# Patient Record
Sex: Male | Born: 1975 | Race: White | Hispanic: No | Smoking: Current every day smoker
Health system: Southern US, Community
[De-identification: ages and names within clinical notes are randomized; demographics above are authoritative.]

## PROBLEM LIST (undated history)

## (undated) DIAGNOSIS — B1920 Unspecified viral hepatitis C without hepatic coma: Secondary | ICD-10-CM

## (undated) DIAGNOSIS — I10 Essential (primary) hypertension: Secondary | ICD-10-CM

## (undated) DIAGNOSIS — F1910 Other psychoactive substance abuse, uncomplicated: Secondary | ICD-10-CM

## (undated) DIAGNOSIS — K922 Gastrointestinal hemorrhage, unspecified: Secondary | ICD-10-CM

## (undated) DIAGNOSIS — M1990 Unspecified osteoarthritis, unspecified site: Secondary | ICD-10-CM

## (undated) DIAGNOSIS — K219 Gastro-esophageal reflux disease without esophagitis: Secondary | ICD-10-CM

## (undated) DIAGNOSIS — D649 Anemia, unspecified: Secondary | ICD-10-CM

## (undated) DIAGNOSIS — M5430 Sciatica, unspecified side: Secondary | ICD-10-CM

## (undated) DIAGNOSIS — M549 Dorsalgia, unspecified: Secondary | ICD-10-CM

## (undated) DIAGNOSIS — G8929 Other chronic pain: Secondary | ICD-10-CM

## (undated) HISTORY — PX: SHOULDER ARTHROSCOPY: SHX128

## (undated) HISTORY — PX: NECK SURGERY: SHX720

## (undated) HISTORY — PX: KNEE SURGERY: SHX244

## (undated) HISTORY — PX: BACK SURGERY: SHX140

## (undated) HISTORY — PX: ANTERIOR CERVICAL DECOMP/DISCECTOMY FUSION: SHX1161

---

## 2001-06-01 ENCOUNTER — Emergency Department (HOSPITAL_COMMUNITY): Admission: EM | Admit: 2001-06-01 | Discharge: 2001-06-01 | Payer: Self-pay | Admitting: Emergency Medicine

## 2003-11-17 ENCOUNTER — Ambulatory Visit (HOSPITAL_COMMUNITY): Admission: RE | Admit: 2003-11-17 | Discharge: 2003-11-17 | Payer: Self-pay | Admitting: Family Medicine

## 2004-06-04 ENCOUNTER — Ambulatory Visit (HOSPITAL_COMMUNITY): Admission: RE | Admit: 2004-06-04 | Discharge: 2004-06-04 | Payer: Self-pay | Admitting: Family Medicine

## 2004-08-28 ENCOUNTER — Encounter: Admission: RE | Admit: 2004-08-28 | Discharge: 2004-08-28 | Payer: Self-pay | Admitting: Neurosurgery

## 2004-09-12 ENCOUNTER — Encounter: Admission: RE | Admit: 2004-09-12 | Discharge: 2004-09-12 | Payer: Self-pay | Admitting: Neurosurgery

## 2005-02-06 ENCOUNTER — Ambulatory Visit (HOSPITAL_COMMUNITY): Admission: RE | Admit: 2005-02-06 | Discharge: 2005-02-07 | Payer: Self-pay | Admitting: Neurosurgery

## 2005-04-02 ENCOUNTER — Ambulatory Visit (HOSPITAL_COMMUNITY): Admission: RE | Admit: 2005-04-02 | Discharge: 2005-04-02 | Payer: Self-pay | Admitting: Neurosurgery

## 2005-08-17 ENCOUNTER — Emergency Department (HOSPITAL_COMMUNITY): Admission: EM | Admit: 2005-08-17 | Discharge: 2005-08-17 | Payer: Self-pay | Admitting: Emergency Medicine

## 2010-01-05 ENCOUNTER — Emergency Department (HOSPITAL_COMMUNITY): Admission: EM | Admit: 2010-01-05 | Discharge: 2010-01-05 | Payer: Self-pay | Admitting: Emergency Medicine

## 2010-01-19 ENCOUNTER — Emergency Department (HOSPITAL_COMMUNITY): Admission: EM | Admit: 2010-01-19 | Discharge: 2010-01-19 | Payer: Self-pay | Admitting: Emergency Medicine

## 2010-02-15 ENCOUNTER — Emergency Department (HOSPITAL_COMMUNITY): Admission: EM | Admit: 2010-02-15 | Discharge: 2010-02-15 | Payer: Self-pay | Admitting: Emergency Medicine

## 2010-04-11 ENCOUNTER — Emergency Department (HOSPITAL_COMMUNITY)
Admission: EM | Admit: 2010-04-11 | Discharge: 2010-04-11 | Payer: Self-pay | Source: Home / Self Care | Admitting: Emergency Medicine

## 2010-04-23 ENCOUNTER — Emergency Department (HOSPITAL_COMMUNITY)
Admission: EM | Admit: 2010-04-23 | Discharge: 2010-04-23 | Payer: Self-pay | Source: Home / Self Care | Admitting: Emergency Medicine

## 2010-07-26 LAB — DIFFERENTIAL
Basophils Absolute: 0 10*3/uL (ref 0.0–0.1)
Basophils Relative: 0 % (ref 0–1)
Eosinophils Absolute: 0.1 10*3/uL (ref 0.0–0.7)
Eosinophils Relative: 2 % (ref 0–5)
Lymphocytes Relative: 24 % (ref 12–46)
Lymphs Abs: 1.7 10*3/uL (ref 0.7–4.0)
Monocytes Absolute: 0.4 10*3/uL (ref 0.1–1.0)
Monocytes Relative: 6 % (ref 3–12)
Neutro Abs: 5 10*3/uL (ref 1.7–7.7)
Neutrophils Relative %: 69 % (ref 43–77)

## 2010-07-26 LAB — CBC
HCT: 41.4 % (ref 39.0–52.0)
Hemoglobin: 14.1 g/dL (ref 13.0–17.0)
MCH: 32 pg (ref 26.0–34.0)
MCHC: 34 g/dL (ref 30.0–36.0)
MCV: 94 fL (ref 78.0–100.0)
Platelets: 169 10*3/uL (ref 150–400)
RBC: 4.4 MIL/uL (ref 4.22–5.81)
RDW: 15.3 % (ref 11.5–15.5)
WBC: 7.3 10*3/uL (ref 4.0–10.5)

## 2010-07-26 LAB — BASIC METABOLIC PANEL
BUN: 10 mg/dL (ref 6–23)
CO2: 27 mEq/L (ref 19–32)
Calcium: 8.8 mg/dL (ref 8.4–10.5)
Chloride: 105 mEq/L (ref 96–112)
Creatinine, Ser: 0.7 mg/dL (ref 0.4–1.5)
GFR calc Af Amer: >60 mL/min (ref >60)
GFR calc non Af Amer: >60 mL/min (ref >60)
Glucose, Bld: 98 mg/dL (ref 70–99)
Potassium: 4.1 mEq/L (ref 3.5–5.1)
Sodium: 137 mEq/L (ref 135–145)

## 2010-09-28 NOTE — Op Note (Signed)
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   OPERATION PERFORMED:  Left L4-5 microdiskectomy using microdissection.   SURGEON:  Cristi Loron, M.D.   ASSISTANT:  Hewitt Shorts, M.D.   ANESTHESIA:  General endotracheal.   ESTIMATED BLOOD LOSS:  Minimal.   SPECIMENS:  None.   DRAINS:  None.   COMPLICATIONS:  None.   INDICATIONS FOR PROCEDURE:  The patient is a 35 year old white male who has  suffered from back and left leg pain.  He failed extensive nonsurgical  management and was worked up with a lumbar MRI which demonstrated herniated  disk L4-5 on the left.  I discussed the various treatment options with him  including surgery.  The patient has weighed the risks, benefits and  alternatives of surgery and decided to proceed with a left L4-5  microdiskectomy.   DESCRIPTION OF PROCEDURE:  The patient was brought to the operating room by  the anesthesia team.  General endotracheal anesthesia was induced.  The  patient was then carefully turned to the prone position on the Wilson frame.  His lumbosacral region was then shaved and prepared with Betadine scrub and  Betadine solution and sterile drapes were applied.  I then injected the area  to be incised with Marcaine with epinephrine solution.  I used a scalpel to  make a linear midline incision over the L4-5 interspace.  I used  electrocautery to perform a left-sided subperiosteal  dissection exposing the  left spinous process and lamina of L4 and L5.  I obtained intraoperative  radiograph to confirm our location.  I then inserted the [facility]  retractor for exposure.   We then brought the operating microscope into the field and under its  magnification and illumination, completed the decompression and  microdissection.  We used a high speed drill to perform a left L4  laminotomy.  We widened the laminotomy with the Kerrison punch and then used  the Kerrison punch to remove the left L4-5 ligamentum flavum.  We then  performed a foraminotomy about the left L5 nerve root.  I then used  microdissection to free up the thecal sac and the nerve root from the  epidural tissue and Dr. Newell Coral  carefully retracted the thecal sac and  nerve root medially with a D'Errico retractor.  This exposed a moderate  sized disk herniation at the disk space.  We incised the intervertebral disk  at the herniation and performed a partial diskectomy using pituitary forceps  and Epstein and Scoville curets. After we were satisfied with intervertebral  diskectomy, we used osteophyte tool to  remove some redundant ligament from  the vertebral end plate at E4-5 to further decompress the neural structures.  We then palpated along the ventral surface of the thecal sac along the exit  route of the left L5 nerve root and noted the neural structures were well  decompressed.  We obtained stringent hemostasis using bipolar  electrocautery.  We copiously irrigated the wound out with bacitracin  solution.  I then removed the solution, removed the McCullough retractor and  then reapproximated the patient's thoracolumbar fascia with interrupted #1  Vicryl sutures, subcutaneous tissue with interrupted 2-0 Vicryl suture and  the skin with Steri-Strips and benzoin. The wound was then coated with  bacitracin ointment, sterile dressing was applied, the drapes were removed.  The patient was subsequently  returned to supine position where he was  extubated by the anesthesia team and transported to the post anesthesia care  unit in stable condition.  All sponge, needle and instrument counts were  correct at the end of the case.      Cristi Loron, M.D.  Electronically Signed     JDJ/MEDQ  D:  02/06/2005  T:  02/07/2005  Job:  [id number]

## 2010-10-11 ENCOUNTER — Emergency Department (HOSPITAL_COMMUNITY)
Admission: EM | Admit: 2010-10-11 | Discharge: 2010-10-11 | Disposition: A | Discharge status: Home / Self Care | Payer: Medicaid Other | Attending: Emergency Medicine | Admitting: Emergency Medicine

## 2010-10-11 ENCOUNTER — Emergency Department (HOSPITAL_COMMUNITY): Payer: Medicaid Other

## 2010-10-11 DIAGNOSIS — Y998 Other external cause status: Secondary | ICD-10-CM | POA: Insufficient documentation

## 2010-10-11 DIAGNOSIS — F17200 Nicotine dependence, unspecified, uncomplicated: Secondary | ICD-10-CM | POA: Insufficient documentation

## 2010-10-11 DIAGNOSIS — Y9361 Activity, american tackle football: Secondary | ICD-10-CM | POA: Insufficient documentation

## 2010-10-11 DIAGNOSIS — S335XXA Sprain of ligaments of lumbar spine, initial encounter: Secondary | ICD-10-CM | POA: Insufficient documentation

## 2010-10-11 DIAGNOSIS — X58XXXA Exposure to other specified factors, initial encounter: Secondary | ICD-10-CM | POA: Insufficient documentation

## 2010-10-23 ENCOUNTER — Ambulatory Visit: Payer: Self-pay | Admitting: Neurosurgery

## 2011-07-07 ENCOUNTER — Emergency Department (HOSPITAL_COMMUNITY): Payer: Medicaid Other

## 2011-07-07 ENCOUNTER — Emergency Department (HOSPITAL_COMMUNITY)
Admission: EM | Admit: 2011-07-07 | Discharge: 2011-07-07 | Disposition: A | Discharge status: Home / Self Care | Payer: Medicaid Other | Attending: Emergency Medicine | Admitting: Emergency Medicine

## 2011-07-07 ENCOUNTER — Encounter (HOSPITAL_COMMUNITY): Payer: Self-pay

## 2011-07-07 DIAGNOSIS — G8929 Other chronic pain: Secondary | ICD-10-CM | POA: Insufficient documentation

## 2011-07-07 DIAGNOSIS — M549 Dorsalgia, unspecified: Secondary | ICD-10-CM | POA: Insufficient documentation

## 2011-07-07 HISTORY — DX: Other chronic pain: G89.29

## 2011-07-07 HISTORY — DX: Dorsalgia, unspecified: M54.9

## 2011-07-07 HISTORY — DX: Sciatica, unspecified side: M54.30

## 2011-07-07 MED ORDER — DIAZEPAM 5 MG PO TABS
5.0000 mg | ORAL_TABLET | Freq: Once | ORAL | Status: AC
  Administered 2011-07-07: 5 mg via ORAL
  Filled 2011-07-07: qty 1

## 2011-07-07 MED ORDER — OXYCODONE-ACETAMINOPHEN 5-325 MG PO TABS: ORAL_TABLET | ORAL | Status: AC

## 2011-07-07 MED ORDER — OXYCODONE-ACETAMINOPHEN 5-325 MG PO TABS
2.0000 | ORAL_TABLET | Freq: Once | ORAL | Status: AC
Start: 2011-07-07 — End: 2011-07-07
  Administered 2011-07-07: 2 via ORAL
  Filled 2011-07-07: qty 2

## 2011-07-07 MED ORDER — METHOCARBAMOL 500 MG PO TABS: 1000.0000 mg | ORAL_TABLET | Freq: Four times a day (QID) | ORAL | Status: AC | PRN

## 2011-07-07 NOTE — ED Notes (Signed)
Pt alert & oriented x4, stable gait. Pt given discharge instructions, paperwork & prescription(s). Patient instructed to stop at the registration desk to finish any additional paperwork. pt verbalized understanding. Pt left department w/ no further questions.  

## 2011-07-07 NOTE — Discharge Instructions (Signed)
RESOURCE GUIDE  Dental Problems  Patients with Medicaid: [facility]                     [facility] [address]                                           [address] Phone:  [phone]                                                  Phone:  [phone]  If unable to pay or uninsured, contact:  [facility] or [facility] to become qualified for the adult dental clinic.  Chronic Pain Problems Contact [facility]  [phone] Patients need to be referred by their primary care doctor.  Insufficient Money for Medicine Contact [employer]:  call "211" or [facility] 271-5999.  No Primary Care Doctor Call [facility]  832-8000 Other agencies that provide inexpensive medical care    [facility]  [phone]    [facility]  [phone]    [facility]  [phone]    Women[facility] Clinic  [phone]    [facility]  [phone]    [facility]  [phone]  Psychological Services [facility]  [phone] [facility]  [phone] [facility]   [phone] (emergency services 641-4993)  Substance Abuse Resources [facility]  336-882-2125 [facility] 336-784-9470 [facility] 336-285-9073 [facility] 336-845-3988 Residential & Outpatient Substance Abuse Program  800-659-3381  Abuse/Neglect [place] Child Abuse Hotline (336) 641-3795 [place] Child Abuse Hotline 800-378-5315 (After Hours)  Emergency Shelter [facility] (336) 271-5985  Maternity Homes [facility] (336) 275-9566 [facility] (704) 372-4663  MRSA Hotline #:   832-7006    [place] [facility]  [facility] [place]     [employer]                          [facility] [address] [place]                       [address]      [address]  [place]                                                [place]                            [place] Phone:  [phone]                                   Phone:  [phone]                 Phone:  [phone]  [facility] Phone:  [phone]    [facility] Child Abuse Hotline (940)094-3858 916-075-1421 (After Hours)    Take the prescriptions as directed.  Apply moist heat or ice to the area(s) of discomfort, for 15 minutes at a time, several times per day for the next few days.  Do not fall asleep on a heating or ice pack.  Call your regular medical doctor and the Pain Management specialist on Monday to schedule a follow up appointment this week.  Return to the Emergency Department immediately if worsening.

## 2011-07-07 NOTE — ED Provider Notes (Signed)
History     CSN: [id number]  Arrival date & time 07/07/11  1725   First MD Initiated Contact with Patient 07/07/11 1914      Chief Complaint  Patient presents with  . Back Pain    HPI Pt was seen at 1925.  Per pt, c/o gradual onset and persistence of constant acute flair of his chronic low back "pain" for the past several years, worse over the past several days.  Denies any change in his usual chronic pain pattern.  Denies incont/retention of bowel or bladder, no saddle anesthesia, no focal motor weakness, no new tingling/numbness in extremities, no fevers, no injury.  The symptoms have been associated with no other complaints. The patient has a significant history of similar symptoms previously, recently being evaluated for this complaint and multiple prior evals for same.    Past Medical History  Diagnosis Date  . Sciatica   . Chronic back pain     Past Surgical History  Procedure Date  . Back surgery   . Neck surgery   . Knee surgery     bilateral     History  Substance Use Topics  . Smoking status: Current Everyday Smoker  . Smokeless tobacco: Not on file  . Alcohol Use: No    Review of Systems ROS: Statement: All systems negative except as marked or noted in the HPI; Constitutional: Negative for fever and chills. ; ; Eyes: Negative for eye pain, redness and discharge. ; ; ENMT: Negative for ear pain, hoarseness, nasal congestion, sinus pressure and sore throat. ; ; Cardiovascular: Negative for chest pain, palpitations, diaphoresis, dyspnea and peripheral edema. ; ; Respiratory: Negative for cough, wheezing and stridor. ; ; Gastrointestinal: Negative for nausea, vomiting, diarrhea, abdominal pain, blood in stool, hematemesis, jaundice and rectal bleeding. . ; ; Genitourinary: Negative for dysuria, flank pain and hematuria. ; ; Musculoskeletal: +LBP.  Negative for neck pain. Negative for swelling and trauma.; ; Skin: Negative for pruritus, rash, abrasions, blisters,  bruising and skin lesion.; ; Neuro: Negative for headache, lightheadedness and neck stiffness. Negative for weakness, altered level of consciousness , altered mental status, extremity weakness, paresthesias, involuntary movement, seizure and syncope.     Allergies  Review of patient's allergies indicates no known allergies.  Home Medications   Current Outpatient Rx  Name Route Sig Dispense Refill  . ESCITALOPRAM OXALATE 10 MG PO TABS Oral Take 10 mg by mouth daily.    Marland Kitchen OMEPRAZOLE 20 MG PO CPDR Oral Take 20 mg by mouth daily.    Marland Kitchen PREGABALIN 200 MG PO CAPS Oral Take 200 mg by mouth 2 (two) times daily.      BP 152/99  Pulse 72  Temp(Src) 97.5 F (36.4 C) (Oral)  Resp 18  Ht 5\' 9"  (1.753 m)  Wt 223 lb (101.152 kg)  BMI 32.93 kg/m2  SpO2 100%  Physical Exam 1930: Physical examination:  Nursing notes reviewed; Vital signs and O2 SAT reviewed;  Constitutional: Well developed, Well nourished, Well hydrated, In no acute distress; Head:  Normocephalic, atraumatic; Eyes: EOMI, PERRL, No scleral icterus; ENMT: Mouth and pharynx normal, Mucous membranes moist; Neck: Supple, Full range of motion, No lymphadenopathy; Cardiovascular: Regular rate and rhythm, No murmur, rub, or gallop; Respiratory: Breath sounds clear & equal bilaterally, No rales, rhonchi, wheezes, or rub, Normal respiratory effort/excursion; Chest: Nontender, Movement normal; Abdomen: Soft, Nontender, Nondistended, Normal bowel sounds; Genitourinary: No CVA tenderness; Spine:  No midline CS, TS, LS tenderness. +mild TTP bilat lower lumbar paraspinal  muscles; Extremities: Pulses normal, No tenderness, No edema, No calf edema or asymmetry.; Neuro: AA&Ox3, Major CN grossly intact. No facial droop, speech clear. Strength 5/5 equal bilat UE's and LE's, including great toe dorsiflexion.  DTR 2/4 equal bilat UE's and LE's.  No gross sensory deficits.  Neg straight leg raises bilat; Skin: Color normal, Warm, Dry, no rash.    ED Course    Procedures  MDM  MDM Reviewed: nursing note, vitals and previous chart Reviewed previous: MRI and x-ray Interpretation: x-ray    Dg Lumbar Spine Complete 07/07/2011  *RADIOLOGY REPORT*  Clinical Data: Back pain.  Left lower extremity radiculopathy. Popping sensation in the back today.  LUMBAR SPINE - COMPLETE 4+ VIEW  Comparison: 10/11/2010  Findings: Posterolateral rod pedicle screw fixation noted at the L4- L5-S1, without hardware complicating features identified.  No fracture or subluxation is observed.  IMPRESSION:  1.  Postoperative findings in the lower lumbar spine, without significant change or acute bony findings.  Original Report Authenticated By: Dellia Cloud, M.D.      9:12 PM:  Improved after meds and wants to go home now.  Dx testing d/w pt and family.  Questions answered.  Verb understanding, agreeable to d/c home with outpt f/u.         Laray Anger, DO 07/08/11 1438

## 2011-07-07 NOTE — ED Notes (Signed)
Pt reports history of back surgery and has had pain in lower back since.  Says this morning felt something pop in lower back and has since had tingling in bottom of feet and legs.  Pt ambulatory.

## 2011-08-15 ENCOUNTER — Other Ambulatory Visit: Payer: Self-pay | Admitting: Neurosurgery

## 2011-08-15 DIAGNOSIS — M549 Dorsalgia, unspecified: Secondary | ICD-10-CM

## 2011-08-22 ENCOUNTER — Ambulatory Visit
Admission: RE | Admit: 2011-08-22 | Discharge: 2011-08-22 | Disposition: A | Discharge status: Home / Self Care | Payer: Medicaid Other | Source: Ambulatory Visit | Attending: Neurosurgery | Admitting: Neurosurgery

## 2011-08-22 DIAGNOSIS — M549 Dorsalgia, unspecified: Secondary | ICD-10-CM

## 2011-08-22 MED ORDER — GADOBENATE DIMEGLUMINE 529 MG/ML IV SOLN
20.0000 mL | Freq: Once | INTRAVENOUS | Status: AC | PRN
  Administered 2011-08-22: 20 mL via INTRAVENOUS

## 2012-02-21 ENCOUNTER — Other Ambulatory Visit: Payer: Self-pay | Admitting: Neurosurgery

## 2012-03-02 ENCOUNTER — Encounter (HOSPITAL_COMMUNITY): Payer: Self-pay | Admitting: Pharmacy Technician

## 2012-03-09 ENCOUNTER — Encounter (HOSPITAL_COMMUNITY): Payer: Self-pay

## 2012-03-09 ENCOUNTER — Encounter (HOSPITAL_COMMUNITY)
Admission: RE | Admit: 2012-03-09 | Discharge: 2012-03-09 | Disposition: A | Discharge status: Home / Self Care | Payer: Medicaid Other | Source: Ambulatory Visit | Attending: Neurosurgery | Admitting: Neurosurgery

## 2012-03-09 HISTORY — DX: Gastro-esophageal reflux disease without esophagitis: K21.9

## 2012-03-09 HISTORY — DX: Unspecified osteoarthritis, unspecified site: M19.90

## 2012-03-09 LAB — TYPE AND SCREEN
ABO/RH(D): A POS
Antibody Screen: NEGATIVE

## 2012-03-09 LAB — CBC
HCT: 48.7 % (ref 39.0–52.0)
Hemoglobin: 17.4 g/dL — ABNORMAL HIGH (ref 13.0–17.0)
MCH: 32.8 pg (ref 26.0–34.0)
MCHC: 35.7 g/dL (ref 30.0–36.0)
MCV: 91.9 fL (ref 78.0–100.0)
Platelets: 198 10*3/uL (ref 150–400)
RBC: 5.3 MIL/uL (ref 4.22–5.81)
RDW: 13.3 % (ref 11.5–15.5)
WBC: 7.8 10*3/uL (ref 4.0–10.5)

## 2012-03-09 LAB — BASIC METABOLIC PANEL WITH GFR
BUN: 14 mg/dL (ref 6–23)
CO2: 23 meq/L (ref 19–32)
Calcium: 9.1 mg/dL (ref 8.4–10.5)
Chloride: 99 meq/L (ref 96–112)
Creatinine, Ser: 0.69 mg/dL (ref 0.50–1.35)
GFR calc Af Amer: >90 mL/min
GFR calc non Af Amer: >90 mL/min
Glucose, Bld: 101 mg/dL — ABNORMAL HIGH (ref 70–99)
Potassium: 4.5 meq/L (ref 3.5–5.1)
Sodium: 133 meq/L — ABNORMAL LOW (ref 135–145)

## 2012-03-09 LAB — SURGICAL PCR SCREEN
MRSA, PCR: NEGATIVE
Staphylococcus aureus: NEGATIVE

## 2012-03-09 NOTE — Pre-Procedure Instructions (Signed)
18 Hunter Bradshaw  03/09/2012   Your procedure is scheduled on:  Thursday, October 31st  Report to [facility] Stay [facility] at 0530 AM.  Call this number if you have problems the morning of surgery: [phone]   Remember:   Do not eat food or drink:After Midnight.   Take these medicines the morning of surgery with A SIP OF WATER: prilosec, lyrica, lexapro, hydrcodone if needed   Do not wear jewelry, make-up or nail polish.  Do not wear lotions, powders, or perfumes.   Do not shave 48 hours prior to surgery. Men may shave face and neck.  Do not bring valuables to the hospital.  Contacts, dentures or bridgework may not be worn into surgery.  Leave suitcase in the car. After surgery it may be brought to your room.  For patients admitted to the hospital, checkout time is 11:00 AM the day of discharge.   Patients discharged the day of surgery will not be allowed to drive home.   Special Instructions: Shower using CHG 2 nights before surgery and the night before surgery.  If you shower the day of surgery use CHG.  Use special wash - you have one bottle of CHG for all showers.  You should use approximately 1/3 of the bottle for each shower.   Please read over the following fact sheets that you were given: Pain Booklet, Coughing and Deep Breathing, Blood Transfusion Information, MRSA Information and Surgical Site Infection Prevention

## 2012-03-11 MED ORDER — CEFAZOLIN SODIUM-DEXTROSE 2-3 GM-% IV SOLR
2.0000 g | INTRAVENOUS | Status: AC
  Administered 2012-03-12: 2 g via INTRAVENOUS
  Filled 2012-03-11: qty 50

## 2012-03-12 ENCOUNTER — Encounter (HOSPITAL_COMMUNITY): Payer: Self-pay | Admitting: Certified Registered Nurse Anesthetist

## 2012-03-12 ENCOUNTER — Inpatient Hospital Stay (HOSPITAL_COMMUNITY)
Admission: RE | Admit: 2012-03-12 | Discharge: 2012-03-14 | DRG: 460 | Disposition: A | Discharge status: Home / Self Care | Payer: Medicaid Other | Source: Ambulatory Visit | Attending: Neurosurgery | Admitting: Neurosurgery

## 2012-03-12 ENCOUNTER — Encounter (HOSPITAL_COMMUNITY): Payer: Self-pay | Admitting: *Deleted

## 2012-03-12 ENCOUNTER — Inpatient Hospital Stay (HOSPITAL_COMMUNITY): Payer: Medicaid Other

## 2012-03-12 ENCOUNTER — Encounter (HOSPITAL_COMMUNITY)
Admission: RE | Disposition: A | Discharge status: Home / Self Care | Payer: Self-pay | Source: Ambulatory Visit | Attending: Neurosurgery

## 2012-03-12 ENCOUNTER — Inpatient Hospital Stay (HOSPITAL_COMMUNITY): Payer: Medicaid Other | Admitting: Certified Registered Nurse Anesthetist

## 2012-03-12 DIAGNOSIS — T84498A Other mechanical complication of other internal orthopedic devices, implants and grafts, initial encounter: Principal | ICD-10-CM | POA: Diagnosis present

## 2012-03-12 DIAGNOSIS — Z79899 Other long term (current) drug therapy: Secondary | ICD-10-CM

## 2012-03-12 DIAGNOSIS — G8929 Other chronic pain: Secondary | ICD-10-CM | POA: Diagnosis present

## 2012-03-12 DIAGNOSIS — Y832 Surgical operation with anastomosis, bypass or graft as the cause of abnormal reaction of the patient, or of later complication, without mention of misadventure at the time of the procedure: Secondary | ICD-10-CM | POA: Diagnosis present

## 2012-03-12 DIAGNOSIS — Z01812 Encounter for preprocedural laboratory examination: Secondary | ICD-10-CM

## 2012-03-12 DIAGNOSIS — K219 Gastro-esophageal reflux disease without esophagitis: Secondary | ICD-10-CM | POA: Diagnosis present

## 2012-03-12 DIAGNOSIS — M129 Arthropathy, unspecified: Secondary | ICD-10-CM | POA: Diagnosis present

## 2012-03-12 DIAGNOSIS — M51379 Other intervertebral disc degeneration, lumbosacral region without mention of lumbar back pain or lower extremity pain: Secondary | ICD-10-CM | POA: Diagnosis present

## 2012-03-12 DIAGNOSIS — M5137 Other intervertebral disc degeneration, lumbosacral region: Secondary | ICD-10-CM | POA: Diagnosis present

## 2012-03-12 DIAGNOSIS — F17200 Nicotine dependence, unspecified, uncomplicated: Secondary | ICD-10-CM | POA: Diagnosis present

## 2012-03-12 SURGERY — POSTERIOR LUMBAR FUSION 2 WITH HARDWARE REMOVAL
Anesthesia: General | Wound class: Clean

## 2012-03-12 MED ORDER — DIPHENHYDRAMINE HCL 50 MG/ML IJ SOLN: 12.5000 mg | Freq: Four times a day (QID) | INTRAMUSCULAR | Status: DC | PRN

## 2012-03-12 MED ORDER — LIDOCAINE HCL (CARDIAC) 20 MG/ML IV SOLN
INTRAVENOUS | Status: DC | PRN
  Administered 2012-03-12: 60 mg via INTRAVENOUS

## 2012-03-12 MED ORDER — ONDANSETRON HCL 4 MG/2ML IJ SOLN: 4.0000 mg | Freq: Once | INTRAMUSCULAR | Status: DC | PRN

## 2012-03-12 MED ORDER — LIDOCAINE HCL 4 % MT SOLN
OROMUCOSAL | Status: DC | PRN
  Administered 2012-03-12: 4 mL via TOPICAL

## 2012-03-12 MED ORDER — ZOLPIDEM TARTRATE 5 MG PO TABS: 5.0000 mg | ORAL_TABLET | Freq: Every evening | ORAL | Status: DC | PRN

## 2012-03-12 MED ORDER — BACITRACIN ZINC 500 UNIT/GM EX OINT
TOPICAL_OINTMENT | CUTANEOUS | Status: DC | PRN
  Administered 2012-03-12: 1 via TOPICAL

## 2012-03-12 MED ORDER — FENTANYL CITRATE 0.05 MG/ML IJ SOLN
INTRAMUSCULAR | Status: DC | PRN
  Administered 2012-03-12: 50 ug via INTRAVENOUS
  Administered 2012-03-12: 200 ug via INTRAVENOUS
  Administered 2012-03-12 (×3): 50 ug via INTRAVENOUS

## 2012-03-12 MED ORDER — PREGABALIN 75 MG PO CAPS
200.0000 mg | ORAL_CAPSULE | Freq: Two times a day (BID) | ORAL | Status: DC
  Administered 2012-03-12 – 2012-03-14 (×4): 200 mg via ORAL
  Filled 2012-03-12 (×2): qty 2
  Filled 2012-03-12: qty 1
  Filled 2012-03-12: qty 4
  Filled 2012-03-12: qty 1

## 2012-03-12 MED ORDER — NICOTINE 21 MG/24HR TD PT24
21.0000 mg | MEDICATED_PATCH | Freq: Every day | TRANSDERMAL | Status: DC
  Administered 2012-03-12 – 2012-03-14 (×3): 21 mg via TRANSDERMAL
  Filled 2012-03-12 (×3): qty 1

## 2012-03-12 MED ORDER — ACETAMINOPHEN 650 MG RE SUPP: 650.0000 mg | RECTAL | Status: DC | PRN

## 2012-03-12 MED ORDER — NALOXONE HCL 0.4 MG/ML IJ SOLN: 0.4000 mg | INTRAMUSCULAR | Status: DC | PRN

## 2012-03-12 MED ORDER — HYDROMORPHONE 0.3 MG/ML IV SOLN
INTRAVENOUS | Status: DC
  Administered 2012-03-12: 21:00:00 via INTRAVENOUS
  Administered 2012-03-13: 4.2 mg via INTRAVENOUS
  Administered 2012-03-13 (×2): via INTRAVENOUS
  Administered 2012-03-13: 3.4 mg via INTRAVENOUS
  Administered 2012-03-14: 1.5 mg via INTRAVENOUS
  Filled 2012-03-12 (×5): qty 25

## 2012-03-12 MED ORDER — VECURONIUM BROMIDE 10 MG IV SOLR
INTRAVENOUS | Status: DC | PRN
  Administered 2012-03-12: 2 mg via INTRAVENOUS

## 2012-03-12 MED ORDER — SODIUM CHLORIDE 0.9 % IJ SOLN: 9.0000 mL | INTRAMUSCULAR | Status: DC | PRN

## 2012-03-12 MED ORDER — DIPHENHYDRAMINE HCL 12.5 MG/5ML PO ELIX: 12.5000 mg | ORAL_SOLUTION | Freq: Four times a day (QID) | ORAL | Status: DC | PRN

## 2012-03-12 MED ORDER — PHENOL 1.4 % MT LIQD: 1.0000 | OROMUCOSAL | Status: DC | PRN

## 2012-03-12 MED ORDER — HYDROMORPHONE HCL PF 1 MG/ML IJ SOLN
INTRAMUSCULAR | Status: AC
  Filled 2012-03-12: qty 1

## 2012-03-12 MED ORDER — ROCURONIUM BROMIDE 100 MG/10ML IV SOLN
INTRAVENOUS | Status: DC | PRN
  Administered 2012-03-12: 50 mg via INTRAVENOUS
  Administered 2012-03-12 (×5): 10 mg via INTRAVENOUS

## 2012-03-12 MED ORDER — MIDAZOLAM HCL 5 MG/5ML IJ SOLN
INTRAMUSCULAR | Status: DC | PRN
  Administered 2012-03-12: 2 mg via INTRAVENOUS

## 2012-03-12 MED ORDER — MORPHINE SULFATE (PF) 1 MG/ML IV SOLN
INTRAVENOUS | Status: DC
  Administered 2012-03-12: 6 mg via INTRAVENOUS
  Administered 2012-03-12: 13:00:00 via INTRAVENOUS
  Administered 2012-03-12: 22.5 mg via INTRAVENOUS
  Administered 2012-03-12: 24 mg via INTRAVENOUS
  Filled 2012-03-12 (×2): qty 25

## 2012-03-12 MED ORDER — SODIUM CHLORIDE 0.9 % IV SOLN
INTRAVENOUS | Status: AC
  Filled 2012-03-12: qty 500

## 2012-03-12 MED ORDER — ACETAMINOPHEN 325 MG PO TABS: 650.0000 mg | ORAL_TABLET | ORAL | Status: DC | PRN

## 2012-03-12 MED ORDER — CEFAZOLIN SODIUM-DEXTROSE 2-3 GM-% IV SOLR
2.0000 g | Freq: Three times a day (TID) | INTRAVENOUS | Status: AC
  Administered 2012-03-12: 2 g via INTRAVENOUS
  Filled 2012-03-12 (×2): qty 50

## 2012-03-12 MED ORDER — PROPOFOL 10 MG/ML IV BOLUS
INTRAVENOUS | Status: DC | PRN
  Administered 2012-03-12: 200 mg via INTRAVENOUS

## 2012-03-12 MED ORDER — EPHEDRINE SULFATE 50 MG/ML IJ SOLN
INTRAMUSCULAR | Status: DC | PRN
  Administered 2012-03-12: 15 mg via INTRAVENOUS

## 2012-03-12 MED ORDER — SODIUM CHLORIDE 0.9 % IR SOLN
Status: DC | PRN
  Administered 2012-03-12: 08:00:00

## 2012-03-12 MED ORDER — ONDANSETRON HCL 4 MG/2ML IJ SOLN: 4.0000 mg | Freq: Four times a day (QID) | INTRAMUSCULAR | Status: DC | PRN

## 2012-03-12 MED ORDER — THROMBIN 20000 UNITS EX SOLR
CUTANEOUS | Status: DC | PRN
  Administered 2012-03-12: 08:00:00 via TOPICAL

## 2012-03-12 MED ORDER — ESCITALOPRAM OXALATE 10 MG PO TABS
10.0000 mg | ORAL_TABLET | Freq: Every day | ORAL | Status: DC
  Administered 2012-03-13 – 2012-03-14 (×2): 10 mg via ORAL
  Filled 2012-03-12 (×2): qty 1

## 2012-03-12 MED ORDER — 0.9 % SODIUM CHLORIDE (POUR BTL) OPTIME
TOPICAL | Status: DC | PRN
  Administered 2012-03-12: 1000 mL

## 2012-03-12 MED ORDER — ARTIFICIAL TEARS OP OINT
TOPICAL_OINTMENT | OPHTHALMIC | Status: DC | PRN
  Administered 2012-03-12: 1 via OPHTHALMIC

## 2012-03-12 MED ORDER — PANTOPRAZOLE SODIUM 40 MG PO TBEC
40.0000 mg | DELAYED_RELEASE_TABLET | Freq: Every day | ORAL | Status: DC
  Administered 2012-03-13 – 2012-03-14 (×2): 40 mg via ORAL
  Filled 2012-03-12 (×2): qty 1

## 2012-03-12 MED ORDER — MORPHINE SULFATE (PF) 1 MG/ML IV SOLN
INTRAVENOUS | Status: AC
  Filled 2012-03-12: qty 25

## 2012-03-12 MED ORDER — LACTATED RINGERS IV SOLN
INTRAVENOUS | Status: DC | PRN
  Administered 2012-03-12 (×4): via INTRAVENOUS

## 2012-03-12 MED ORDER — PREGABALIN 75 MG PO CAPS: 200.0000 mg | ORAL_CAPSULE | Freq: Two times a day (BID) | ORAL | Status: DC

## 2012-03-12 MED ORDER — ALBUMIN HUMAN 5 % IV SOLN
INTRAVENOUS | Status: DC | PRN
  Administered 2012-03-12: 09:00:00 via INTRAVENOUS

## 2012-03-12 MED ORDER — ONDANSETRON HCL 4 MG/2ML IJ SOLN
INTRAMUSCULAR | Status: DC | PRN
  Administered 2012-03-12: 4 mg via INTRAVENOUS

## 2012-03-12 MED ORDER — DIAZEPAM 5 MG PO TABS
5.0000 mg | ORAL_TABLET | Freq: Four times a day (QID) | ORAL | Status: DC | PRN
  Administered 2012-03-12 – 2012-03-13 (×2): 5 mg via ORAL
  Filled 2012-03-12 (×2): qty 1

## 2012-03-12 MED ORDER — MENTHOL 3 MG MT LOZG: 1.0000 | LOZENGE | OROMUCOSAL | Status: DC | PRN

## 2012-03-12 MED ORDER — LACTATED RINGERS IV SOLN
INTRAVENOUS | Status: DC
  Administered 2012-03-12 – 2012-03-14 (×5): via INTRAVENOUS

## 2012-03-12 MED ORDER — HEMOSTATIC AGENTS (NO CHARGE) OPTIME
TOPICAL | Status: DC | PRN
  Administered 2012-03-12: 1 via TOPICAL

## 2012-03-12 MED ORDER — NEOSTIGMINE METHYLSULFATE 1 MG/ML IJ SOLN
INTRAMUSCULAR | Status: DC | PRN
  Administered 2012-03-12: 4 mg via INTRAVENOUS

## 2012-03-12 MED ORDER — ONDANSETRON HCL 4 MG/2ML IJ SOLN: 4.0000 mg | INTRAMUSCULAR | Status: DC | PRN

## 2012-03-12 MED ORDER — HYDROMORPHONE HCL PF 1 MG/ML IJ SOLN
0.2500 mg | INTRAMUSCULAR | Status: DC | PRN
  Administered 2012-03-12 (×4): 0.5 mg via INTRAVENOUS

## 2012-03-12 MED ORDER — HYDROCODONE-ACETAMINOPHEN 5-325 MG PO TABS
1.0000 | ORAL_TABLET | ORAL | Status: DC | PRN
  Administered 2012-03-12: 2 via ORAL
  Filled 2012-03-12 (×2): qty 2

## 2012-03-12 MED ORDER — DOCUSATE SODIUM 100 MG PO CAPS
100.0000 mg | ORAL_CAPSULE | Freq: Two times a day (BID) | ORAL | Status: DC
  Administered 2012-03-12 – 2012-03-14 (×4): 100 mg via ORAL
  Filled 2012-03-12 (×5): qty 1

## 2012-03-12 MED ORDER — OXYCODONE-ACETAMINOPHEN 5-325 MG PO TABS
1.0000 | ORAL_TABLET | ORAL | Status: DC | PRN
  Administered 2012-03-12 – 2012-03-14 (×11): 2 via ORAL
  Filled 2012-03-12 (×11): qty 2
  Filled 2012-03-12: qty 1

## 2012-03-12 MED ORDER — BUPIVACAINE-EPINEPHRINE PF 0.5-1:200000 % IJ SOLN
INTRAMUSCULAR | Status: DC | PRN
  Administered 2012-03-12: 30 mL

## 2012-03-12 MED ORDER — GLYCOPYRROLATE 0.2 MG/ML IJ SOLN
INTRAMUSCULAR | Status: DC | PRN
  Administered 2012-03-12: .8 mg via INTRAVENOUS

## 2012-03-12 MED ORDER — BACITRACIN 50000 UNITS IM SOLR
INTRAMUSCULAR | Status: AC
  Filled 2012-03-12: qty 1

## 2012-03-12 SURGICAL SUPPLY — 72 items
BAG DECANTER FOR FLEXI CONT (MISCELLANEOUS) ×2 IMPLANT
BENZOIN TINCTURE PRP APPL 2/3 (GAUZE/BANDAGES/DRESSINGS) ×2 IMPLANT
BLADE SURG ROTATE 9660 (MISCELLANEOUS) IMPLANT
BRUSH SCRUB EZ PLAIN DRY (MISCELLANEOUS) ×2 IMPLANT
BUR ACORN 6.0 (BURR) ×2 IMPLANT
BUR MATCHSTICK NEURO 3.0 LAGG (BURR) ×2 IMPLANT
BUR PRECISION FLUTE 6.0 (BURR) ×2 IMPLANT
CANISTER SUCTION 2500CC (MISCELLANEOUS) ×2 IMPLANT
CLOTH BEACON ORANGE TIMEOUT ST (SAFETY) ×2 IMPLANT
CONT SPEC 4OZ CLIKSEAL STRL BL (MISCELLANEOUS) ×4 IMPLANT
COVER BACK TABLE 24X17X13 BIG (DRAPES) IMPLANT
COVER TABLE BACK 60X90 (DRAPES) ×2 IMPLANT
DRAPE C-ARM 42X72 X-RAY (DRAPES) IMPLANT
DRAPE LAPAROTOMY 100X72X124 (DRAPES) ×2 IMPLANT
DRAPE POUCH INSTRU U-SHP 10X18 (DRAPES) ×2 IMPLANT
DRAPE PROXIMA HALF (DRAPES) ×2 IMPLANT
DRAPE SURG 17X23 STRL (DRAPES) ×8 IMPLANT
ELECT BLADE 4.0 EZ CLEAN MEGAD (MISCELLANEOUS) ×2
ELECT REM PT RETURN 9FT ADLT (ELECTROSURGICAL) ×2
ELECTRODE BLDE 4.0 EZ CLN MEGD (MISCELLANEOUS) ×1 IMPLANT
ELECTRODE REM PT RTRN 9FT ADLT (ELECTROSURGICAL) ×1 IMPLANT
GAUZE SPONGE 4X4 16PLY XRAY LF (GAUZE/BANDAGES/DRESSINGS) ×2 IMPLANT
GLOVE BIO SURGEON STRL SZ8.5 (GLOVE) ×4 IMPLANT
GLOVE BIOGEL PI IND STRL 8 (GLOVE) ×2 IMPLANT
GLOVE BIOGEL PI INDICATOR 8 (GLOVE) ×2
GLOVE ECLIPSE 7.5 STRL STRAW (GLOVE) ×8 IMPLANT
GLOVE EXAM NITRILE LRG STRL (GLOVE) IMPLANT
GLOVE EXAM NITRILE MD LF STRL (GLOVE) ×2 IMPLANT
GLOVE EXAM NITRILE XL STR (GLOVE) IMPLANT
GLOVE EXAM NITRILE XS STR PU (GLOVE) IMPLANT
GLOVE SS BIOGEL STRL SZ 8 (GLOVE) ×2 IMPLANT
GLOVE SUPERSENSE BIOGEL SZ 8 (GLOVE) ×2
GOWN BRE IMP SLV AUR LG STRL (GOWN DISPOSABLE) ×2 IMPLANT
GOWN BRE IMP SLV AUR XL STRL (GOWN DISPOSABLE) ×4 IMPLANT
GOWN STRL REIN 2XL LVL4 (GOWN DISPOSABLE) ×2 IMPLANT
KIT BASIN OR (CUSTOM PROCEDURE TRAY) ×2 IMPLANT
KIT INFUSE SMALL (Orthopedic Implant) ×2 IMPLANT
KIT ROOM TURNOVER OR (KITS) ×2 IMPLANT
MILL MEDIUM DISP (BLADE) ×2 IMPLANT
NEEDLE HYPO 21X1.5 SAFETY (NEEDLE) IMPLANT
NEEDLE HYPO 25X1 1.5 SAFETY (NEEDLE) ×2 IMPLANT
NS IRRIG 1000ML POUR BTL (IV SOLUTION) ×2 IMPLANT
PACK FOAM VITOSS 10CC (Orthopedic Implant) ×2 IMPLANT
PACK LAMINECTOMY NEURO (CUSTOM PROCEDURE TRAY) ×2 IMPLANT
PAD ARMBOARD 7.5X6 YLW CONV (MISCELLANEOUS) ×6 IMPLANT
PATTIES SURGICAL .5 X1 (DISPOSABLE) IMPLANT
PUTTY 5ML ACTIFUSE ABX (Putty) ×4 IMPLANT
SCREW SET SOLERA (Screw) ×6 IMPLANT
SCREW SET SOLERA TI5.5 (Screw) ×6 IMPLANT
SOLERA (Screw) ×6 IMPLANT
SOLERA CROSSLINK 45MM X-10 CROSSLINK ×2 IMPLANT
SOLERA ROD ×2 IMPLANT
SOLERA ROD (Rod) ×2 IMPLANT
SOLERA SCREW (Screw) ×4 IMPLANT
SOLERA SCREWS (Screw) ×4 IMPLANT
SPACER CAPSTONE SPINE SY 12X26 (Orthopedic Implant) ×2 IMPLANT
SPONGE GAUZE 4X4 12PLY (GAUZE/BANDAGES/DRESSINGS) ×2 IMPLANT
SPONGE LAP 4X18 X RAY DECT (DISPOSABLE) IMPLANT
SPONGE NEURO XRAY DETECT 1X3 (DISPOSABLE) IMPLANT
SPONGE SURGIFOAM ABS GEL 100 (HEMOSTASIS) ×2 IMPLANT
STRIP CLOSURE SKIN 1/2X4 (GAUZE/BANDAGES/DRESSINGS) ×2 IMPLANT
SUT VIC AB 1 CT1 18XBRD ANBCTR (SUTURE) ×2 IMPLANT
SUT VIC AB 1 CT1 8-18 (SUTURE) ×2
SUT VIC AB 2-0 CP2 18 (SUTURE) ×4 IMPLANT
SYR 20CC LL (SYRINGE) IMPLANT
SYR 20ML ECCENTRIC (SYRINGE) ×2 IMPLANT
SYSTEM CAPSTONE SPINE PK 10X26 (Orthopedic Implant) ×2 IMPLANT
TAPE CLOTH SURG 4X10 WHT LF (GAUZE/BANDAGES/DRESSINGS) ×2 IMPLANT
TOWEL OR 17X24 6PK STRL BLUE (TOWEL DISPOSABLE) ×2 IMPLANT
TOWEL OR 17X26 10 PK STRL BLUE (TOWEL DISPOSABLE) ×2 IMPLANT
TRAY FOLEY CATH 14FRSI W/METER (CATHETERS) ×2 IMPLANT
WATER STERILE IRR 1000ML POUR (IV SOLUTION) ×2 IMPLANT

## 2012-03-12 NOTE — Progress Notes (Signed)
UR COMPLETED  

## 2012-03-12 NOTE — Clinical Social Work Note (Signed)
Clinical Social Work  CSW received a consult for SNF. Awaiting PT/OT evals for discharge recommendations. CSW will assess if appropriate for SNF. CSW will continue to follow. Please call with any urgent needs.   Dede Query, MSW, Theresia Majors [phone]

## 2012-03-12 NOTE — Anesthesia Preprocedure Evaluation (Addendum)
Anesthesia Evaluation  Patient identified by MRN, date of birth, ID band Patient awake    Reviewed: Allergy & Precautions, H&P , NPO status , Patient's Chart, lab work & pertinent test results  Airway Mallampati: II TM Distance: >3 FB Neck ROM: Full    Dental  (+) Dental Advisory Given   Pulmonary Current Smoker,          Cardiovascular Rhythm:regular Rate:Normal     Neuro/Psych    GI/Hepatic GERD-  Medicated and Controlled,  Endo/Other    Renal/GU      Musculoskeletal   Abdominal   Peds  Hematology   Anesthesia Other Findings   Reproductive/Obstetrics                          Anesthesia Physical Anesthesia Plan  ASA: II  Anesthesia Plan: General   Post-op Pain Management:    Induction: Intravenous  Airway Management Planned: Oral ETT  Additional Equipment:   Intra-op Plan:   Post-operative Plan: Extubation in OR  Informed Consent: I have reviewed the patients History and Physical, chart, labs and discussed the procedure including the risks, benefits and alternatives for the proposed anesthesia with the patient or authorized representative who has indicated his/her understanding and acceptance.   Dental advisory given  Plan Discussed with: CRNA, Anesthesiologist and Surgeon  Anesthesia Plan Comments:         Anesthesia Quick Evaluation

## 2012-03-12 NOTE — Anesthesia Postprocedure Evaluation (Signed)
  Anesthesia Post-op Note  Patient: Hunter Bradshaw  Procedure(s) Performed: Procedure(s) (LRB) with comments: POSTERIOR LUMBAR FUSION 2 WITH HARDWARE REMOVAL (N/A) - exploration of lumbar fusion with redo of Lumbar four-five Lumbar five sacral one posterior lumbar interbody fusion with interbody prothesis posterolateral arthrodesis and poserior segmental instrumentation  Patient Location: PACU  Anesthesia Type:General  Level of Consciousness: awake, alert , oriented and patient cooperative  Airway and Oxygen Therapy: Patient Spontanous Breathing  Post-op Pain: mild  Post-op Assessment: Post-op Vital signs reviewed, Patient's Cardiovascular Status Stable, Respiratory Function Stable, Patent Airway, No signs of Nausea or vomiting and Pain level controlled  Post-op Vital Signs: stable  Complications: No apparent anesthesia complications

## 2012-03-12 NOTE — H&P (Signed)
Subjective: The patient is a 36 year old white male who's undergone a previous L4-5 and L5-S1 instrumentation and fusion by the mother physician in another town. He has had persistent back and leg pain. He has failed medical management. He was worked up with a lumbar CT which demonstrated patient had evidence consistent with a pseudoarthrosis. I discussed the various treatment options with the patient including surgery. He has weighed the risks, benefits, and alternatives surgery decided proceed with a redo L4-5 decompression instrumentation and fusion.   Past Medical History  Diagnosis Date  . Sciatica   . Chronic back pain   . GERD (gastroesophageal reflux disease)   . Arthritis     Past Surgical History  Procedure Date  . Neck surgery   . Knee surgery     bilateral ACL repair  . Back surgery     3 prior back surgeries  . Anterior cervical decomp/discectomy fusion   . Shoulder arthroscopy     Left    No Known Allergies  History  Substance Use Topics  . Smoking status: Current Every Day Smoker -- 0.5 packs/day  . Smokeless tobacco: Not on file  . Alcohol Use: No    History reviewed. No pertinent family history. Prior to Admission medications   Medication Sig Start Date End Date Taking? Authorizing Provider  escitalopram (LEXAPRO) 10 MG tablet Take 10 mg by mouth daily.   Yes Historical Provider, MD  HYDROcodone-acetaminophen (NORCO) 10-325 MG per tablet Take 1 tablet by mouth every 6 (six) hours as needed. For pain   Yes Historical Provider, MD  omeprazole (PRILOSEC) 20 MG capsule Take 20 mg by mouth daily.   Yes Historical Provider, MD  pregabalin (LYRICA) 200 MG capsule Take 200 mg by mouth 2 (two) times daily.   Yes Historical Provider, MD     Review of Systems  Positive ROS: As above  All other systems have been reviewed and were otherwise negative with the exception of those mentioned in the HPI and as above.  Objective: Vital signs in last 24 hours: Temp:  [97.8  F (36.6 C)] 97.8 F (36.6 C) (10/31 0609) Pulse Rate:  [77] 77  (10/31 0609) Resp:  [18] 18  (10/31 0609) BP: (127)/(86) 127/86 mmHg (10/31 0609) SpO2:  [95 %] 95 % (10/31 0609)  General Appearance: Alert, cooperative, no distress, appears stated age Head: Normocephalic, without obvious abnormality, atraumatic Eyes: PERRL, conjunctiva/corneas clear, EOM's intact, fundi benign, both eyes      Ears: Normal TM's and external ear canals, both ears Throat: Lips, mucosa, and tongue normal; teeth and gums normal Neck: Supple, symmetrical, trachea midline, no adenopathy; thyroid: No enlargement/tenderness/nodules; no carotid bruit or JVD Back: Symmetric, no curvature, ROM normal, no CVA tenderness. The patient's lumbar incision is well-healed. Lungs: Clear to auscultation bilaterally, respirations unlabored Heart: Regular rate and rhythm, S1 and S2 normal, no murmur, rub or gallop Abdomen: Soft, non-tender, bowel sounds active all four quadrants, no masses, no organomegaly Extremities: Extremities normal, atraumatic, no cyanosis or edema Pulses: 2+ and symmetric all extremities Skin: Skin color, texture, turgor normal, no rashes or lesions  NEUROLOGIC:   Mental status: alert and oriented, no aphasia, good attention span, Fund of knowledge/ memory ok Motor Exam - grossly normal Sensory Exam - grossly normal Reflexes: Symmetric Coordination - grossly normal Gait - grossly normal Balance - grossly normal Cranial Nerves: I: smell Not tested  II: visual acuity  OS: Normal    OD: Normal   II: visual fields Full to  confrontation  II: pupils Equal, round, reactive to light  III,VII: ptosis None  III,IV,VI: extraocular muscles  Full ROM  V: mastication Normal  V: facial light touch sensation  Normal  V,VII: corneal reflex  Present  VII: facial muscle function - upper  Normal  VII: facial muscle function - lower Normal  VIII: hearing Not tested  IX: soft palate elevation  Normal  IX,X:  gag reflex Present  XI: trapezius strength  5/5  XI: sternocleidomastoid strength 5/5  XI: neck flexion strength  5/5  XII: tongue strength  Normal    Data Review Lab Results  Component Value Date   WBC 7.8 03/09/2012   HGB 17.4* 03/09/2012   HCT 48.7 03/09/2012   MCV 91.9 03/09/2012   PLT 198 03/09/2012   Lab Results  Component Value Date   NA 133* 03/09/2012   K 4.5 03/09/2012   CL 99 03/09/2012   CO2 23 03/09/2012   BUN 14 03/09/2012   CREATININE 0.69 03/09/2012   GLUCOSE 101* 03/09/2012   No results found for this basename: INR, PROTIME    Assessment/Plan: 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 I have reviewed his MR scan and CT scan with him. I've pointed out the abnormalities. We have discussed the various treatment options including a redo decompression instrumentation and fusion. I have described the surgery to him. I have shown surgical models. We have discussed the risks, benefits, alternatives, and likelihood of achieving our goals with surgery. I've answered all the patient's questions. He wants to proceed with surgery.   Secily Walthour D 03/12/2012 7:31 AM

## 2012-03-12 NOTE — Anesthesia Procedure Notes (Signed)
Procedure Name: Intubation Date/Time: 03/12/2012 7:56 AM Performed by: Rogelia Boga Pre-anesthesia Checklist: Patient identified, Emergency Drugs available, Suction available, Patient being monitored and Timeout performed Patient Re-evaluated:Patient Re-evaluated prior to inductionOxygen Delivery Method: Circle system utilized Preoxygenation: Pre-oxygenation with 100% oxygen Intubation Type: IV induction Ventilation: Mask ventilation without difficulty and Oral airway inserted - appropriate to patient size Laryngoscope Size: Mac and 4 Grade View: Grade I Tube type: Oral Tube size: 7.5 mm Airway Equipment and Method: Stylet Placement Confirmation: ETT inserted through vocal cords under direct vision,  positive ETCO2 and breath sounds checked- equal and bilateral Secured at: 22 cm Tube secured with: Tape Dental Injury: Teeth and Oropharynx as per pre-operative assessment

## 2012-03-12 NOTE — Progress Notes (Signed)
Subjective:  The patient is alert and pleasant. He looks well. He requested nicotine patch.  Objective: Vital signs in last 24 hours: Temp:  [97.8 F (36.6 C)-97.9 F (36.6 C)] 97.9 F (36.6 C) (10/31 1243) Pulse Rate:  [77] 77  (10/31 0609) Resp:  [18] 18  (10/31 0609) BP: (127)/(86) 127/86 mmHg (10/31 0609) SpO2:  [95 %-97 %] 97 % (10/31 1243)  Intake/Output from previous day:   Intake/Output this shift: Total I/O In: 3450 [I.V.:3200; IV Piggyback:250] Out: 325 [Urine:175; Blood:150]  Physical exam the patient is alert and pleasant. He is moving his lower extremities well.  Lab Results:  Basename 03/09/12 1600  WBC 7.8  HGB 17.4*  HCT 48.7  PLT 198   BMET  Basename 03/09/12 1600  NA 133*  K 4.5  CL 99  CO2 23  GLUCOSE 101*  BUN 14  CREATININE 0.69  CALCIUM 9.1    Studies/Results: No results found.  Assessment/Plan: The patient is doing well.  LOS: 0 days     Conal Shetley D 03/12/2012, 1:03 PM

## 2012-03-12 NOTE — Preoperative (Signed)
Beta Blockers   Reason not to administer Beta Blockers:Not Applicable 

## 2012-03-12 NOTE — Progress Notes (Signed)
Pt transferred from P ACU awake ,alert and orient xv4 answers question appropriately  Pain assessed and will medicate. Orders noted and released ,  No neuro deficit at present will continue  to monitor

## 2012-03-12 NOTE — Transfer of Care (Signed)
Immediate Anesthesia Transfer of Care Note  Patient: Hunter Bradshaw  Procedure(s) Performed: Procedure(s) (LRB) with comments: POSTERIOR LUMBAR FUSION 2 WITH HARDWARE REMOVAL (N/A) - exploration of lumbar fusion with redo of Lumbar four-five Lumbar five sacral one posterior lumbar interbody fusion with interbody prothesis posterolateral arthrodesis and poserior segmental instrumentation  Patient Location: PACU  Anesthesia Type:General  Level of Consciousness: awake, alert , oriented and patient cooperative  Airway & Oxygen Therapy: Patient Spontanous Breathing and Patient connected to nasal cannula oxygen  Post-op Assessment: Report given to PACU RN, Post -op Vital signs reviewed and stable and Patient moving all extremities X 4  Post vital signs: Reviewed and stable  Complications: No apparent anesthesia complications

## 2012-03-12 NOTE — Op Note (Signed)
Brief history: The patient is a 36 year old white male who is undergone an L4-5 and L5-S1 decompression and fusion at another facility by another physician years ago. He has had persistent back pain. He has failed medical management. 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 I discussed the various treatment options with the patient including surgery.  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 L4-5 and L5-S1 posterior lumbar interbody fusion with local morselized autograft bone and Actifusebone graft extender; insertion of interbody prosthesis at L4-5 and L5-S1 (Medtronic peek interbody prosthesis); posterior segmental instrumentation from L4 to S1 with Medtronic titanium pedicle screws and rods; posterior lateral arthrodesis at L4-5 and L5-S1 with local morselized autograft bone and Vitoss bone graft extender. Exploration of lumbar fusion with removal of old hardware from L4-S1.  Surgeon: Dr. Delma Officer  Asst.: Dr. Colon Branch  Anesthesia: Gen. endotracheal  Estimated blood loss: 150 cc  Drains: None  Locations: None  Description of procedure: The patient was brought to the operating room by the anesthesia team. General endotracheal anesthesia was induced. The patient was turned to the prone position on the Wilson frame. The patient's lumbosacral region was then prepared with Betadine scrub and Betadine solution. Sterile drapes were applied.  I then injected the area to be incised with Marcaine with epinephrine solution. I then used the scalpel to make a linear midline incision over the L4-5 and L5-S1 interspace incising through the previous surgical scar. I then used electrocautery to perform a bilateral subperiosteal dissection exposing the spinous process and lamina of L3, L4, L5 and S1 as well as  exposing the old hardware.  We explored the old fusion by removing the capsule and the old screws that we then removed the rods. It was obvious that the patient had a pseudarthrosis at L5-S1 as the S1 screws were loose. There was a more mild motion at L4-5. We then removed the screws. The left S1 pedicle screw was fractured. Rate will to remove the extra screw using the universal removal set.   We now turned our attention to the posterior lumbar interbody fusion. I used a scalpel to incise the intervertebral disc at L4-5 and L5-S1 the left.(The patient had interbody prosthesis already in on the right.). I then performed a partial intervertebral discectomy at L4-5 and L5-S1 on the left using the pituitary forceps. We prepared the vertebral endplates at L4-5 and L5-S1 on the left for the fusion by removing the soft tissues with the curettes. We then used the trial spacers to pick the appropriate sized interbody prosthesis. We prefilled his prosthesis with a combination of local morselized autograft bone that we obtained during the decompression as well as Actifuse bone graft extender. We inserted the prefilled prosthesis into the interspace at L4-5 and L5-S1 on the left. There was a good snug fit of the prosthesis in the interspace. We then filled and the remainder of the intervertebral disc space with local morselized autograft bone and Actifuse. This completed the posterior lumbar interbody arthrodesis.  We now turned attention to the instrumentation. Under fluoroscopic guidance we reinserted pedicle screws into the pedicles at L4, L5 and S1 bilaterally. Using the old holes, we inserted a 7.5 x 50 mm screws at L4, 7.5 x 45 bilaterally at L5, and an 8.5 and 9.5 x 40 into the S1 pedicles. We got a good  bony purchase at each pedicle. We then connected the unilateral pedicle screws with a lordotic rod. We compressed the construct and secured the rod in place with the caps. We then tightened the caps  appropriately. We then placed a cross connector between the rods and tightened appropriately. This completed the instrumentation from L4-S1.  We now turned our attention to the posterior lateral arthrodesis at L4-5 and L5-S1. We used a Gaffer and high-speed drill to remove the soft tissue overlying the remainder of the facets pars transverse processes etc. at L4-5 and L5-S1 bilaterally. We laid bone morphogenic protein-soaked collagen sponges over the decorticated posterior lateral structures. We then applied a combination of local morselized autograft bone and Vitoss bone graft extender over these decorticated posterior lateral structures. This completed the posterior lateral arthrodesis.  We then obtained hemostasis using bipolar electrocautery. We irrigated the wound out with bacitracin solution. We inspected the thecal sac and nerve roots and noted they were well decompressed. We then removed the retractor. We reapproximated patient's thoracolumbar fascia with interrupted #1 Vicryl suture. We reapproximated patient's subcutaneous tissue with interrupted 2-0 Vicryl suture. The reapproximated patient's skin with Steri-Strips and benzoin. The wound was then coated with bacitracin ointment. A sterile dressing was applied. The drapes were removed. The patient was subsequently returned to the supine position where they were extubated by the anesthesia team. He was then transported to the post anesthesia care unit in stable condition. All sponge instrument and needle counts were reportedly correct at the end of this case.

## 2012-03-13 MED ORDER — DSS 100 MG PO CAPS: 100.0000 mg | ORAL_CAPSULE | Freq: Two times a day (BID) | ORAL | Status: DC

## 2012-03-13 MED ORDER — DIAZEPAM 5 MG PO TABS: 5.0000 mg | ORAL_TABLET | Freq: Four times a day (QID) | ORAL | Status: DC | PRN

## 2012-03-13 MED ORDER — OXYCODONE-ACETAMINOPHEN 10-325 MG PO TABS
1.0000 | ORAL_TABLET | ORAL | Status: DC | PRN
Start: 2012-03-13 — End: 2012-08-24

## 2012-03-13 NOTE — Evaluation (Signed)
Physical Therapy Evaluation Patient Details Name: Hunter Bradshaw MRN: [id number] DOB: May 08, 1976 Today's Date: 03/13/2012 Time: 1110-1130 PT Time Calculation (min): 20 min  PT Assessment / Plan / Recommendation Clinical Impression  pt presents with L4-S1 PLIF.  pt notes this is his 4th back surgery, however needed cueing throughout session for back precautions and safe technique.  pt has good support at home, but most likely will need new DME.  pt notes he paid out of pocket for DME about 2 yrs ago, but they are stored in a shed that he and his ex-wife are fighting over, so he is not sure he will have access to DME.      PT Assessment  Patient needs continued PT services    Follow Up Recommendations  Home health PT;Supervision - Intermittent    Does the patient have the potential to tolerate intense rehabilitation      Barriers to Discharge None      Equipment Recommendations  Rolling walker with 5" wheels;3 in 1 bedside comode;Tub/shower seat    Recommendations for Other Services     Frequency Min 5X/week    Precautions / Restrictions Precautions Precautions: Back Precaution Booklet Issued: Yes (comment) Precaution Comments: Reviewed all precautions with pt. Required Braces or Orthoses: Spinal Brace Spinal Brace: Lumbar corset;Applied in sitting position Restrictions Weight Bearing Restrictions: No   Pertinent Vitals/Pain Pt c/o pain, but did not rate.  Pt using PCA.        Mobility  Bed Mobility Bed Mobility: Rolling Right;Right Sidelying to Sit Rolling Right: 4: Min guard;With rail Right Sidelying to Sit: 4: Min assist;With rails;HOB flat Details for Bed Mobility Assistance: VCs for proper log roll technique. Transfers Transfers: Sit to Stand;Stand to Sit Sit to Stand: 4: Min guard;With upper extremity assist;From bed;From chair/3-in-1 Stand to Sit: 4: Min guard;With upper extremity assist;With armrests;To chair/3-in-1 Details for Transfer Assistance: Min VCs for  hand placement, technique and posture. Ambulation/Gait Ambulation/Gait Assistance: 4: Min guard Ambulation Distance (Feet): 15 Feet (x2) Assistive device: Rolling walker Ambulation/Gait Assistance Details: pt had just returned to bed after walking with RN, but was agreeable to OOB around room with PT.  Cues for back precautions during turns.   Gait Pattern: Step-through pattern;Decreased stride length;Trunk flexed Stairs: No Wheelchair Mobility Wheelchair Mobility: No    Shoulder Instructions     Exercises     PT Diagnosis: Difficulty walking;Acute pain  PT Problem List: Decreased activity tolerance;Decreased balance;Decreased mobility;Decreased knowledge of use of DME;Decreased knowledge of precautions;Pain PT Treatment Interventions: DME instruction;Gait training;Functional mobility training;Therapeutic activities;Therapeutic exercise;Balance training;Patient/family education   PT Goals Acute Rehab PT Goals PT Goal Formulation: With patient Time For Goal Achievement: 03/20/12 Potential to Achieve Goals: Good Pt will Roll Supine to Right Side: with modified independence PT Goal: Rolling Supine to Right Side - Progress: Goal set today Pt will Roll Supine to Left Side: with modified independence PT Goal: Rolling Supine to Left Side - Progress: Goal set today Pt will go Supine/Side to Sit: with modified independence PT Goal: Supine/Side to Sit - Progress: Goal set today Pt will go Sit to Supine/Side: with modified independence PT Goal: Sit to Supine/Side - Progress: Goal set today Pt will go Sit to Stand: with supervision PT Goal: Sit to Stand - Progress: Goal set today Pt will go Stand to Sit: with supervision PT Goal: Stand to Sit - Progress: Goal set today Pt will Ambulate: >150 feet;with supervision;with rolling walker PT Goal: Ambulate - Progress: Goal set today Additional Goals  Additional Goal #1: pt will verbalize and demo back precautions.   PT Goal: Additional Goal #1 -  Progress: Goal set today  Visit Information  Last PT Received On: 03/13/12 Assistance Needed: +1 PT/OT Co-Evaluation/Treatment: Yes    Subjective Data  Subjective: I just walked that first loop.   Patient Stated Goal: Home   Prior Functioning  Home Living Lives With: Significant other Available Help at Discharge: Family;Available 24 hours/day Type of Home: Mobile home Home Access: Ramped entrance Home Layout: One level Bathroom Shower/Tub: Tub/shower unit Home Adaptive Equipment: Bedside commode/3-in-1;Shower chair with back Additional Comments: pt's DME is in a storage building he and his wife are battling over.  So may not have access to DME and will need new DME.   Prior Function Level of Independence: Independent Able to Take Stairs?: Yes Driving: No Vocation: On disability Communication Communication: No difficulties    Cognition  Overall Cognitive Status: Appears within functional limits for tasks assessed/performed Arousal/Alertness: Awake/alert Orientation Level: Appears intact for tasks assessed Behavior During Session: [facility] for tasks performed    Extremity/Trunk Assessment Right Upper Extremity Assessment RUE ROM/Strength/Tone: [facility] for tasks assessed Left Upper Extremity Assessment LUE ROM/Strength/Tone: WFL for tasks assessed Right Lower Extremity Assessment RLE ROM/Strength/Tone: WFL for tasks assessed RLE Sensation: WFL - Light Touch Left Lower Extremity Assessment LLE ROM/Strength/Tone: WFL for tasks assessed LLE Sensation: WFL - Light Touch Trunk Assessment Trunk Assessment: Normal   Balance Balance Balance Assessed: No  End of Session PT - End of Session Equipment Utilized During Treatment: Gait belt;Back brace Activity Tolerance: Patient tolerated treatment well Patient left: in bed;with call bell/phone within reach Nurse Communication: Mobility status  GP     Sunny Schlein, [place] [phone] 03/13/2012, 3:29 PM

## 2012-03-13 NOTE — Progress Notes (Signed)
Patient ID: Hunter Bradshaw, male   DOB: [birth year], 36 y.o.   MRN: [id number] Subjective:  The patient is alert and pleasant. His back is appropriate sore. He looks well.  Objective: Vital signs in last 24 hours: Temp:  [97.7 F (36.5 C)-99 F (37.2 C)] 98.1 F (36.7 C) (11/01 0648) Pulse Rate:  [89-102] 98  (11/01 0648) Resp:  [13-20] 16  (11/01 0800) BP: (126-157)/(63-100) 146/84 mmHg (11/01 0648) SpO2:  [97 %-100 %] 100 % (11/01 0800)  Intake/Output from previous day: 10/31 0701 - 11/01 0700 In: 3450 [I.V.:3200; IV Piggyback:250] Out: 1575 [Urine:1425; Blood:150] Intake/Output this shift:    Physical exam the patient is alert and oriented. His strength is grossly normal in his lower extremities. His dressing is clean and dry.  Lab Results: No results found for this basename: WBC:2,HGB:2,HCT:2,PLT:2 in the last 72 hours BMET No results found for this basename: NA:2,K:2,CL:2,CO2:2,GLUCOSE:2,BUN:2,CREATININE:2,CALCIUM:2 in the last 72 hours  Studies/Results: Dg Lumbar Spine 2-3 Views  03/12/2012  *RADIOLOGY REPORT*  Clinical Data: Fusion L4-5 and L5-S1.  LUMBAR SPINE - 2-3 VIEW  Comparison: 08/22/2011  Findings: Two intraoperative C-arm views submitted for review at surgery.  Previously, the patient had bilateral pedicle screws and posterior connecting bar placed at the L4-5 and L5 S1 level.  The posterior connecting bars have been removed.  Pedicle screws are noted bilaterally at the L4, L5 and S1 level.  Interbody spacer L4-5 and L5-L1.  The CT detected lucency surrounding the S1 pedicle screws and subsiding of the interbody spacer not as well appreciated on the present intraoperative plain film exam.  Degree of nonunion incompletely assessed.  IMPRESSION: Two intraoperative C-arm views submitted for review at surgery.  Previously, the patient had bilateral pedicle screws and posterior connecting bar placed at the L4-5 and L5 S1 level.  The posterior connecting bars have been removed.   Pedicle screws are noted bilaterally at the L4, L5 and S1 level.  Interbody spacer L4-5 and L5-L1.  The CT detected lucency surrounding the S1 pedicle screws and subsiding of the interbody spacer not as well appreciated on the present intraoperative plain film exam.  Degree of nonunion incompletely assessed.   Original Report Authenticated By: Lacy Duverney, M.Bradshaw.    Dg C-arm 1-60 Min-no Report  03/12/2012  CLINICAL DATA: PLIF 4-0,9W1   C-ARM 1-60 MINUTES  Fluoroscopy was utilized by the requesting physician.  No radiographic  interpretation.      Assessment/Plan: Postop day 1: The patient is doing well. He will likely go home tomorrow. I have given him his discharge instructions and answered all his questions.  LOS: 1 day     Hunter Bradshaw 03/13/2012, 8:12 AM

## 2012-03-13 NOTE — Evaluation (Signed)
Occupational Therapy Evaluation Patient Details Name: Hunter Bradshaw MRN: [id number] DOB: Sep 24, 1975 Today's Date: 03/13/2012 Time: 4098-1191 OT Time Calculation (min): 23 min  OT Assessment / Plan / Recommendation Clinical Impression  Pt doing well POD 1 TLIF. This is pt's 4th back fusion. All education completed. Pt will have necessary level of A from family/friends upon d/c.    OT Assessment  Patient does not need any further OT services    Follow Up Recommendations  No OT follow up    Barriers to Discharge      Equipment Recommendations  Rolling walker with 5" wheels;3 in 1 bedside comode    Recommendations for Other Services    Frequency       Precautions / Restrictions Precautions Precautions: Back Precaution Booklet Issued: Yes (comment) Precaution Comments: Reviewed all precautions with pt. Required Braces or Orthoses: Spinal Brace Spinal Brace: Lumbar corset;Applied in sitting position   Pertinent Vitals/Pain Pt reported low back pain which he did not rate. Repositioned for comfort.    ADL  Grooming: Simulated;Min guard Where Assessed - Grooming: Supported standing Upper Body Bathing: Simulated;Set up Where Assessed - Upper Body Bathing: Unsupported sitting Lower Body Bathing: Simulated;Set up Where Assessed - Lower Body Bathing: Supported sit to stand Upper Body Dressing: Simulated;Set up Where Assessed - Upper Body Dressing: Unsupported sitting Lower Body Dressing: Simulated;Set up Where Assessed - Lower Body Dressing: Supported sit to stand Toilet Transfer: Performed;Min Pension scheme manager Method: Sit to Barista: Raised toilet seat with arms (or 3-in-1 over toilet) Toileting - Clothing Manipulation and Hygiene: Simulated;Min guard Where Assessed - Toileting Clothing Manipulation and Hygiene: Sit to stand from 3-in-1 or toilet Equipment Used: Rolling walker;Back brace Transfers/Ambulation Related to ADLs: Pt ambulated to the  bathroom with minguard A and RW.  ADL Comments: Pt easily able to cross ankle over opposite knee for LB ADLs. Pt stated he had no trouble stepping into and out of tub following previous back surgeries.    OT Diagnosis:    OT Problem List:   OT Treatment Interventions:     OT Goals    Visit Information  Last OT Received On: 03/13/12 Assistance Needed: +1 PT/OT Co-Evaluation/Treatment: Yes    Subjective Data  Subjective: [name] Patient Stated Goal: To return home tomorrow.   Prior Functioning     Home Living Lives With: Significant other Available Help at Discharge: Family;Available 24 hours/day Type of Home: Mobile home Home Access: Ramped entrance Home Layout: One level Bathroom Shower/Tub: Tub/shower unit Home Adaptive Equipment: Bedside commode/3-in-1;Shower chair with back Additional Comments: pt's DME is in a storage building he and his wife are battling over.  So may not have access to DME and will need new DME.   Prior Function Level of Independence: Independent Able to Take Stairs?: Yes Driving: No Vocation: On disability         Vision/Perception     Cognition  Overall Cognitive Status: Appears within functional limits for tasks assessed/performed Arousal/Alertness: Awake/alert Orientation Level: Appears intact for tasks assessed Behavior During Session: [facility] for tasks performed    Extremity/Trunk Assessment Right Upper Extremity Assessment RUE ROM/Strength/Tone: [facility] for tasks assessed Left Upper Extremity Assessment LUE ROM/Strength/Tone: WFL for tasks assessed     Mobility Bed Mobility Bed Mobility: Rolling Right;Right Sidelying to Sit Rolling Right: 4: Min guard;With rail Right Sidelying to Sit: 4: Min assist;With rails;HOB flat Details for Bed Mobility Assistance: VCs for proper log roll technique. Transfers Transfers: Sit  to Stand;Stand to Sit Sit to Stand: 4: Min guard;With upper extremity assist;From bed;From  chair/3-in-1 Stand to Sit: 4: Min guard;With upper extremity assist;With armrests;To chair/3-in-1 Details for Transfer Assistance: Min VCs for hand placement, technique and posture.     Shoulder Instructions     Exercise     Balance     End of Session OT - End of Session Equipment Utilized During Treatment: Back brace Activity Tolerance: Patient tolerated treatment well Patient left: in chair;with call bell/phone within reach;with family/visitor present  GO     Hunter Bradshaw A OTR/L [phone] 03/13/2012, 11:46 AM

## 2012-03-14 NOTE — Discharge Summary (Signed)
Physician Discharge Summary  Patient ID: Hunter Bradshaw MRN: [id number] DOB/AGE: November 29, 1975 36 y.o.  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 *    Discharged Condition: good  Hospital Course: [name] was admitted for a redo surgery to correct a psuedoarthrosis of his previous fusion. He did well with surgery and has been improving steadily since. Wound is clean and dry. Strength is full. He is voiding and ambulating without difficulty.   Consults: None  Significant Diagnostic Studies: none  Treatments: surgery: L4-5 and L5-S1 posterior lumbar interbody fusion with local morselized autograft bone and Actifusebone graft extender; insertion of interbody prosthesis at L4-5 and L5-S1 (Medtronic peek interbody prosthesis); posterior segmental instrumentation from L4 to S1 with Medtronic titanium pedicle screws and rods; posterior lateral arthrodesis at L4-5 and L5-S1 with local morselized autograft bone and Vitoss bone graft extender. Exploration of lumbar fusion with removal of old hardware from L4-S1.   Discharge Exam: Blood pressure 147/86, pulse 107, temperature 98 F (36.7 C), temperature source Oral, resp. rate 18, SpO2 98.00%. General appearance: alert, cooperative, appears stated age and no distress Neurologic: Alert and oriented X 3, normal strength and tone. Normal symmetric reflexes. Normal coordination and gait  Disposition: 01-Home or Self Care     Medication List     As of 03/14/2012 11:53 AM    STOP taking these medications         HYDROcodone-acetaminophen 10-325 MG per tablet   Commonly known as: NORCO      TAKE these medications         diazepam 5 MG tablet   Commonly known as: VALIUM   Take 1 tablet (5 mg total) by mouth  every 6 (six) hours as needed.      DSS 100 MG Caps   Take 100 mg by mouth 2 (two) times daily.      escitalopram 10 MG tablet   Commonly known as: LEXAPRO   Take 10 mg by mouth daily.      omeprazole 20 MG capsule   Commonly known as: PRILOSEC   Take 20 mg by mouth daily.      oxyCODONE-acetaminophen 10-325 MG per tablet   Commonly known as: PERCOCET   Take 1 tablet by mouth every 4 (four) hours as needed for pain.      pregabalin 200 MG capsule   Commonly known as: LYRICA   Take 200 mg by mouth 2 (two) times daily.         Signed: Tiffney Haughton L 03/14/2012, 11:53 AM

## 2012-03-14 NOTE — Progress Notes (Signed)
   CARE MANAGEMENT NOTE 03/14/2012  Patient:  Hunter Bradshaw, Hunter Bradshaw   Account Number:  [id number]  Date Initiated:  03/14/2012  Documentation initiated by:  [facility]  Subjective/Objective Assessment:   L4-5 and L5-S1 posterior lumbar interbody fusion     Action/Plan:   Anticipated DC Date:  03/14/2012   Anticipated DC Plan:  HOME/SELF CARE      DC Planning Services  CM consult      Choice offered to / List presented to:     DME arranged  [facility]      DME agency  [facility]        Status of service:  Completed, signed off Medicare Important Message given?   (If response is "NO", the following Medicare IM given date fields will be blank) Date Medicare IM given:   Date Additional Medicare IM given:    Discharge Disposition:  HOME/SELF CARE  Per UR Regulation:    If discussed at Long Length of Stay Meetings, dates discussed:    Comments:  03/14/2012 1200 NCM spoke to pt and HH not order. Pt states he does need DME for home. NCM contacted [facility] for DME for scheduled d/c today. Isidoro Donning RN CCM Case Mgmt phone [phone]

## 2012-03-16 MED FILL — Sodium Chloride IV Soln 0.9%: INTRAVENOUS | Qty: 1000 | Status: AC

## 2012-03-16 MED FILL — Heparin Sodium (Porcine) Inj 1000 Unit/ML: INTRAMUSCULAR | Qty: 30 | Status: AC

## 2012-03-25 ENCOUNTER — Other Ambulatory Visit: Payer: Self-pay | Admitting: Neurosurgery

## 2012-03-25 DIAGNOSIS — M79606 Pain in leg, unspecified: Secondary | ICD-10-CM

## 2012-03-26 ENCOUNTER — Ambulatory Visit
Admission: RE | Admit: 2012-03-26 | Discharge: 2012-03-26 | Disposition: A | Discharge status: Home / Self Care | Payer: Medicaid Other | Source: Ambulatory Visit | Attending: Neurosurgery | Admitting: Neurosurgery

## 2012-03-26 DIAGNOSIS — M79606 Pain in leg, unspecified: Secondary | ICD-10-CM

## 2012-03-30 ENCOUNTER — Other Ambulatory Visit: Payer: Self-pay | Admitting: Neurosurgery

## 2012-03-30 ENCOUNTER — Encounter (HOSPITAL_COMMUNITY): Payer: Self-pay | Admitting: Pharmacy Technician

## 2012-04-01 ENCOUNTER — Encounter (HOSPITAL_COMMUNITY): Payer: Self-pay

## 2012-04-01 ENCOUNTER — Encounter (HOSPITAL_COMMUNITY)
Admission: RE | Admit: 2012-04-01 | Discharge: 2012-04-01 | Disposition: A | Discharge status: Home / Self Care | Payer: Medicaid Other | Source: Ambulatory Visit | Attending: Neurosurgery | Admitting: Neurosurgery

## 2012-04-01 LAB — BASIC METABOLIC PANEL
CO2: 25 mEq/L (ref 19–32)
Chloride: 99 mEq/L (ref 96–112)
GFR calc non Af Amer: >90 mL/min (ref >90)
Glucose, Bld: 114 mg/dL — ABNORMAL HIGH (ref 70–99)
Potassium: 4.1 mEq/L (ref 3.5–5.1)
Sodium: 134 mEq/L — ABNORMAL LOW (ref 135–145)

## 2012-04-01 LAB — CBC
HCT: 40.8 % (ref 39.0–52.0)
Hemoglobin: 14.2 g/dL (ref 13.0–17.0)
MCH: 32.1 pg (ref 26.0–34.0)
RBC: 4.42 MIL/uL (ref 4.22–5.81)

## 2012-04-01 LAB — TYPE AND SCREEN: ABO/RH(D): A POS

## 2012-04-01 MED ORDER — CEFAZOLIN SODIUM-DEXTROSE 2-3 GM-% IV SOLR
2.0000 g | INTRAVENOUS | Status: AC
  Administered 2012-04-02: 2 g via INTRAVENOUS
  Filled 2012-04-01: qty 50

## 2012-04-01 NOTE — Pre-Procedure Instructions (Addendum)
[address] BLADE SCHEFF  04/01/2012   Your procedure is scheduled on: November 21  Report to [facility] Stay [facility] at 2:00 PM.  Call this number if you have problems the morning of surgery: [phone]   Remember:   Do not eat or drink:After Midnight.  Take these medicines the morning of surgery with A SIP OF WATER: Valium (if needed), Lexapro, Prilosec, Percocet, Lyrica   Do not wear jewelry, make-up or nail polish.  Do not wear lotions, powders, or perfumes. You may wear deodorant.  Do not shave 48 hours prior to surgery. Men may shave face and neck.  Do not bring valuables to the hospital.  Contacts, dentures or bridgework may not be worn into surgery.  Leave suitcase in the car. After surgery it may be brought to your room.  For patients admitted to the hospital, checkout time is 11:00 AM the day of discharge.   Special Instructions: Shower using CHG 2 nights before surgery and the night before surgery.  If you shower the day of surgery use CHG.  Use special wash - you have one bottle of CHG for all showers.  You should use approximately 1/3 of the bottle for each shower.   Please read over the following fact sheets that you were given: Pain Booklet, Coughing and Deep Breathing and Surgical Site Infection Prevention

## 2012-04-02 ENCOUNTER — Encounter (HOSPITAL_COMMUNITY): Payer: Self-pay | Admitting: Anesthesiology

## 2012-04-02 ENCOUNTER — Encounter (HOSPITAL_COMMUNITY)
Admission: RE | Disposition: A | Discharge status: Home / Self Care | Payer: Self-pay | Source: Ambulatory Visit | Attending: Neurosurgery

## 2012-04-02 ENCOUNTER — Encounter (HOSPITAL_COMMUNITY): Payer: Self-pay | Admitting: Certified Registered Nurse Anesthetist

## 2012-04-02 ENCOUNTER — Ambulatory Visit (HOSPITAL_COMMUNITY): Payer: Medicaid Other | Admitting: Anesthesiology

## 2012-04-02 ENCOUNTER — Ambulatory Visit (HOSPITAL_COMMUNITY): Payer: Medicaid Other

## 2012-04-02 ENCOUNTER — Inpatient Hospital Stay (HOSPITAL_COMMUNITY)
Admission: RE | Admit: 2012-04-02 | Discharge: 2012-04-03 | DRG: 030 | Disposition: A | Discharge status: Home / Self Care | Payer: Medicaid Other | Source: Ambulatory Visit | Attending: Neurosurgery | Admitting: Neurosurgery

## 2012-04-02 DIAGNOSIS — M545 Low back pain, unspecified: Secondary | ICD-10-CM | POA: Diagnosis present

## 2012-04-02 DIAGNOSIS — K219 Gastro-esophageal reflux disease without esophagitis: Secondary | ICD-10-CM | POA: Diagnosis present

## 2012-04-02 DIAGNOSIS — F17200 Nicotine dependence, unspecified, uncomplicated: Secondary | ICD-10-CM | POA: Diagnosis present

## 2012-04-02 DIAGNOSIS — Z79899 Other long term (current) drug therapy: Secondary | ICD-10-CM

## 2012-04-02 DIAGNOSIS — M129 Arthropathy, unspecified: Secondary | ICD-10-CM | POA: Diagnosis present

## 2012-04-02 DIAGNOSIS — G8929 Other chronic pain: Principal | ICD-10-CM | POA: Diagnosis present

## 2012-04-02 DIAGNOSIS — M5416 Radiculopathy, lumbar region: Secondary | ICD-10-CM

## 2012-04-02 DIAGNOSIS — Z981 Arthrodesis status: Secondary | ICD-10-CM

## 2012-04-02 DIAGNOSIS — M5430 Sciatica, unspecified side: Secondary | ICD-10-CM | POA: Diagnosis present

## 2012-04-02 SURGERY — POSTERIOR LUMBAR FUSION 1 WITH HARDWARE REMOVAL
Anesthesia: General | Site: Back | Wound class: Clean

## 2012-04-02 MED ORDER — DIPHENHYDRAMINE HCL 12.5 MG/5ML PO ELIX: 12.5000 mg | ORAL_SOLUTION | Freq: Four times a day (QID) | ORAL | Status: DC | PRN

## 2012-04-02 MED ORDER — BACITRACIN ZINC 500 UNIT/GM EX OINT
TOPICAL_OINTMENT | CUTANEOUS | Status: DC | PRN
  Administered 2012-04-02: 1 via TOPICAL

## 2012-04-02 MED ORDER — SODIUM CHLORIDE 0.9 % IR SOLN
Status: DC | PRN
  Administered 2012-04-02: 17:00:00

## 2012-04-02 MED ORDER — ONDANSETRON HCL 4 MG/2ML IJ SOLN
INTRAMUSCULAR | Status: DC | PRN
  Administered 2012-04-02: 4 mg via INTRAVENOUS

## 2012-04-02 MED ORDER — DOCUSATE SODIUM 100 MG PO CAPS
100.0000 mg | ORAL_CAPSULE | Freq: Two times a day (BID) | ORAL | Status: DC
  Administered 2012-04-02 – 2012-04-03 (×2): 100 mg via ORAL
  Filled 2012-04-02 (×2): qty 1

## 2012-04-02 MED ORDER — PROPOFOL 10 MG/ML IV BOLUS
INTRAVENOUS | Status: DC | PRN
  Administered 2012-04-02: 200 mg via INTRAVENOUS

## 2012-04-02 MED ORDER — ACETAMINOPHEN 650 MG RE SUPP: 650.0000 mg | RECTAL | Status: DC | PRN

## 2012-04-02 MED ORDER — HYDROCODONE-ACETAMINOPHEN 5-325 MG PO TABS: 1.0000 | ORAL_TABLET | ORAL | Status: DC | PRN

## 2012-04-02 MED ORDER — SODIUM CHLORIDE 0.9 % IJ SOLN: 9.0000 mL | INTRAMUSCULAR | Status: DC | PRN

## 2012-04-02 MED ORDER — NEOSTIGMINE METHYLSULFATE 1 MG/ML IJ SOLN
INTRAMUSCULAR | Status: DC | PRN
  Administered 2012-04-02: 4 mg via INTRAVENOUS

## 2012-04-02 MED ORDER — LACTATED RINGERS IV SOLN
INTRAVENOUS | Status: DC
  Administered 2012-04-02: 22:00:00 via INTRAVENOUS

## 2012-04-02 MED ORDER — NALOXONE HCL 0.4 MG/ML IJ SOLN: 0.4000 mg | INTRAMUSCULAR | Status: DC | PRN

## 2012-04-02 MED ORDER — BUPIVACAINE-EPINEPHRINE PF 0.5-1:200000 % IJ SOLN
INTRAMUSCULAR | Status: DC | PRN
  Administered 2012-04-02: 6 mL

## 2012-04-02 MED ORDER — MORPHINE SULFATE (PF) 1 MG/ML IV SOLN
INTRAVENOUS | Status: DC
  Administered 2012-04-02: 19:00:00 via INTRAVENOUS

## 2012-04-02 MED ORDER — ZOLPIDEM TARTRATE 5 MG PO TABS: 5.0000 mg | ORAL_TABLET | Freq: Every evening | ORAL | Status: DC | PRN

## 2012-04-02 MED ORDER — LACTATED RINGERS IV SOLN
INTRAVENOUS | Status: DC | PRN
  Administered 2012-04-02 (×2): via INTRAVENOUS

## 2012-04-02 MED ORDER — OXYCODONE HCL 5 MG PO TABS
5.0000 mg | ORAL_TABLET | ORAL | Status: DC | PRN
  Administered 2012-04-03: 5 mg via ORAL
  Filled 2012-04-02: qty 1

## 2012-04-02 MED ORDER — ONDANSETRON HCL 4 MG/2ML IJ SOLN: 4.0000 mg | Freq: Four times a day (QID) | INTRAMUSCULAR | Status: DC | PRN

## 2012-04-02 MED ORDER — CEFAZOLIN SODIUM-DEXTROSE 2-3 GM-% IV SOLR
2.0000 g | Freq: Three times a day (TID) | INTRAVENOUS | Status: AC
  Administered 2012-04-02 – 2012-04-03 (×2): 2 g via INTRAVENOUS
  Filled 2012-04-02 (×2): qty 50

## 2012-04-02 MED ORDER — ARTIFICIAL TEARS OP OINT
TOPICAL_OINTMENT | OPHTHALMIC | Status: DC | PRN
  Administered 2012-04-02: 1 via OPHTHALMIC

## 2012-04-02 MED ORDER — DIAZEPAM 5 MG PO TABS
5.0000 mg | ORAL_TABLET | Freq: Four times a day (QID) | ORAL | Status: DC | PRN
  Administered 2012-04-03: 5 mg via ORAL
  Filled 2012-04-02: qty 1

## 2012-04-02 MED ORDER — OXYCODONE-ACETAMINOPHEN 5-325 MG PO TABS
1.0000 | ORAL_TABLET | ORAL | Status: DC | PRN
  Administered 2012-04-03: 1 via ORAL
  Filled 2012-04-02: qty 1

## 2012-04-02 MED ORDER — ESCITALOPRAM OXALATE 10 MG PO TABS
10.0000 mg | ORAL_TABLET | Freq: Every day | ORAL | Status: DC
  Administered 2012-04-02 – 2012-04-03 (×2): 10 mg via ORAL
  Filled 2012-04-02 (×2): qty 1

## 2012-04-02 MED ORDER — ACETAMINOPHEN 325 MG PO TABS: 650.0000 mg | ORAL_TABLET | ORAL | Status: DC | PRN

## 2012-04-02 MED ORDER — OXYCODONE-ACETAMINOPHEN 10-325 MG PO TABS: 1.0000 | ORAL_TABLET | ORAL | Status: DC | PRN

## 2012-04-02 MED ORDER — MENTHOL 3 MG MT LOZG: 1.0000 | LOZENGE | OROMUCOSAL | Status: DC | PRN

## 2012-04-02 MED ORDER — DOCUSATE SODIUM 100 MG PO CAPS: 100.0000 mg | ORAL_CAPSULE | Freq: Two times a day (BID) | ORAL | Status: DC

## 2012-04-02 MED ORDER — HYDROMORPHONE HCL PF 1 MG/ML IJ SOLN: 0.2500 mg | INTRAMUSCULAR | Status: DC | PRN

## 2012-04-02 MED ORDER — FENTANYL CITRATE 0.05 MG/ML IJ SOLN
INTRAMUSCULAR | Status: DC | PRN
  Administered 2012-04-02 (×2): 50 ug via INTRAVENOUS
  Administered 2012-04-02: 100 ug via INTRAVENOUS

## 2012-04-02 MED ORDER — SODIUM CHLORIDE 0.9 % IV SOLN
INTRAVENOUS | Status: AC
  Filled 2012-04-02: qty 500

## 2012-04-02 MED ORDER — PHENOL 1.4 % MT LIQD: 1.0000 | OROMUCOSAL | Status: DC | PRN

## 2012-04-02 MED ORDER — PANTOPRAZOLE SODIUM 40 MG PO TBEC
40.0000 mg | DELAYED_RELEASE_TABLET | Freq: Every day | ORAL | Status: DC
  Administered 2012-04-02 – 2012-04-03 (×2): 40 mg via ORAL
  Filled 2012-04-02 (×2): qty 1

## 2012-04-02 MED ORDER — ONDANSETRON HCL 4 MG/2ML IJ SOLN: 4.0000 mg | INTRAMUSCULAR | Status: DC | PRN

## 2012-04-02 MED ORDER — THROMBIN 20000 UNITS EX SOLR
CUTANEOUS | Status: DC | PRN
  Administered 2012-04-02: 17:00:00 via TOPICAL

## 2012-04-02 MED ORDER — PHENYLEPHRINE HCL 10 MG/ML IJ SOLN
INTRAMUSCULAR | Status: DC | PRN
  Administered 2012-04-02 (×2): 80 ug via INTRAVENOUS
  Administered 2012-04-02: 40 ug via INTRAVENOUS

## 2012-04-02 MED ORDER — BACITRACIN 50000 UNITS IM SOLR
INTRAMUSCULAR | Status: AC
  Filled 2012-04-02: qty 1

## 2012-04-02 MED ORDER — MORPHINE SULFATE (PF) 1 MG/ML IV SOLN
INTRAVENOUS | Status: AC
  Filled 2012-04-02: qty 25

## 2012-04-02 MED ORDER — ROCURONIUM BROMIDE 100 MG/10ML IV SOLN
INTRAVENOUS | Status: DC | PRN
  Administered 2012-04-02: 10 mg via INTRAVENOUS
  Administered 2012-04-02: 25 mg via INTRAVENOUS

## 2012-04-02 MED ORDER — HYDROMORPHONE HCL PF 1 MG/ML IJ SOLN
INTRAMUSCULAR | Status: AC
  Filled 2012-04-02: qty 1

## 2012-04-02 MED ORDER — PREGABALIN 75 MG PO CAPS
200.0000 mg | ORAL_CAPSULE | Freq: Two times a day (BID) | ORAL | Status: DC
  Administered 2012-04-02 – 2012-04-03 (×2): 200 mg via ORAL
  Filled 2012-04-02 (×2): qty 1

## 2012-04-02 MED ORDER — ONDANSETRON HCL 4 MG/2ML IJ SOLN: 4.0000 mg | Freq: Once | INTRAMUSCULAR | Status: DC | PRN

## 2012-04-02 MED ORDER — GLYCOPYRROLATE 0.2 MG/ML IJ SOLN
INTRAMUSCULAR | Status: DC | PRN
  Administered 2012-04-02: .6 mg via INTRAVENOUS

## 2012-04-02 MED ORDER — DIPHENHYDRAMINE HCL 50 MG/ML IJ SOLN: 12.5000 mg | Freq: Four times a day (QID) | INTRAMUSCULAR | Status: DC | PRN

## 2012-04-02 MED ORDER — LIDOCAINE HCL (CARDIAC) 20 MG/ML IV SOLN
INTRAVENOUS | Status: DC | PRN
  Administered 2012-04-02: 100 mg via INTRAVENOUS

## 2012-04-02 MED ORDER — 0.9 % SODIUM CHLORIDE (POUR BTL) OPTIME
TOPICAL | Status: DC | PRN
  Administered 2012-04-02: 1000 mL

## 2012-04-02 SURGICAL SUPPLY — 63 items
BAG DECANTER FOR FLEXI CONT (MISCELLANEOUS) ×2 IMPLANT
BENZOIN TINCTURE PRP APPL 2/3 (GAUZE/BANDAGES/DRESSINGS) ×2 IMPLANT
BLADE SURG ROTATE 9660 (MISCELLANEOUS) IMPLANT
BRUSH SCRUB EZ PLAIN DRY (MISCELLANEOUS) ×2 IMPLANT
BUR ACORN 6.0 (BURR) IMPLANT
BUR MATCHSTICK NEURO 3.0 LAGG (BURR) ×2 IMPLANT
CANISTER SUCTION 2500CC (MISCELLANEOUS) ×2 IMPLANT
CLOTH BEACON ORANGE TIMEOUT ST (SAFETY) ×2 IMPLANT
CONT SPEC 4OZ CLIKSEAL STRL BL (MISCELLANEOUS) ×2 IMPLANT
COVER BACK TABLE 24X17X13 BIG (DRAPES) IMPLANT
DRAIN JACKSON PRATT 10MM FLAT (MISCELLANEOUS) ×2 IMPLANT
DRAPE C-ARM 42X72 X-RAY (DRAPES) ×4 IMPLANT
DRAPE LAPAROTOMY 100X72X124 (DRAPES) ×2 IMPLANT
DRAPE POUCH INSTRU U-SHP 10X18 (DRAPES) ×2 IMPLANT
DRAPE PROXIMA HALF (DRAPES) ×2 IMPLANT
DRAPE SURG 17X23 STRL (DRAPES) ×8 IMPLANT
ELECT BLADE 4.0 EZ CLEAN MEGAD (MISCELLANEOUS) ×4
ELECT REM PT RETURN 9FT ADLT (ELECTROSURGICAL) ×2
ELECTRODE BLDE 4.0 EZ CLN MEGD (MISCELLANEOUS) ×2 IMPLANT
ELECTRODE REM PT RTRN 9FT ADLT (ELECTROSURGICAL) ×1 IMPLANT
EVACUATOR SILICONE 100CC (DRAIN) ×2 IMPLANT
GAUZE SPONGE 4X4 16PLY XRAY LF (GAUZE/BANDAGES/DRESSINGS) ×2 IMPLANT
GLOVE BIO SURGEON STRL SZ 6.5 (GLOVE) ×8 IMPLANT
GLOVE BIO SURGEON STRL SZ8 (GLOVE) ×4 IMPLANT
GLOVE BIO SURGEON STRL SZ8.5 (GLOVE) ×4 IMPLANT
GLOVE BIOGEL PI IND STRL 6.5 (GLOVE) ×1 IMPLANT
GLOVE BIOGEL PI IND STRL 8.5 (GLOVE) ×1 IMPLANT
GLOVE BIOGEL PI INDICATOR 6.5 (GLOVE) ×1
GLOVE BIOGEL PI INDICATOR 8.5 (GLOVE) ×1
GLOVE EXAM NITRILE LRG STRL (GLOVE) IMPLANT
GLOVE EXAM NITRILE MD LF STRL (GLOVE) IMPLANT
GLOVE EXAM NITRILE XL STR (GLOVE) IMPLANT
GLOVE EXAM NITRILE XS STR PU (GLOVE) IMPLANT
GLOVE SS BIOGEL STRL SZ 8 (GLOVE) ×2 IMPLANT
GLOVE SUPERSENSE BIOGEL SZ 8 (GLOVE) ×2
GOWN BRE IMP SLV AUR LG STRL (GOWN DISPOSABLE) ×2 IMPLANT
GOWN BRE IMP SLV AUR XL STRL (GOWN DISPOSABLE) ×6 IMPLANT
GOWN STRL REIN 2XL LVL4 (GOWN DISPOSABLE) IMPLANT
KIT BASIN OR (CUSTOM PROCEDURE TRAY) ×2 IMPLANT
KIT ROOM TURNOVER OR (KITS) ×2 IMPLANT
NEEDLE HYPO 21X1.5 SAFETY (NEEDLE) IMPLANT
NEEDLE HYPO 25X1 1.5 SAFETY (NEEDLE) ×2 IMPLANT
NS IRRIG 1000ML POUR BTL (IV SOLUTION) ×2 IMPLANT
PACK LAMINECTOMY NEURO (CUSTOM PROCEDURE TRAY) ×2 IMPLANT
PAD ARMBOARD 7.5X6 YLW CONV (MISCELLANEOUS) ×6 IMPLANT
PATTIES SURGICAL .5 X1 (DISPOSABLE) IMPLANT
SCREW SET SOLERA (Screw) ×3 IMPLANT
SCREW SET SOLERA TI5.5 (Screw) ×3 IMPLANT
SPONGE GAUZE 4X4 12PLY (GAUZE/BANDAGES/DRESSINGS) ×2 IMPLANT
SPONGE LAP 4X18 X RAY DECT (DISPOSABLE) IMPLANT
SPONGE NEURO XRAY DETECT 1X3 (DISPOSABLE) IMPLANT
SPONGE SURGIFOAM ABS GEL 100 (HEMOSTASIS) ×2 IMPLANT
STRIP CLOSURE SKIN 1/2X4 (GAUZE/BANDAGES/DRESSINGS) ×2 IMPLANT
SUT VIC AB 1 CT1 18XBRD ANBCTR (SUTURE) ×2 IMPLANT
SUT VIC AB 1 CT1 8-18 (SUTURE) ×2
SUT VIC AB 2-0 CP2 18 (SUTURE) ×2 IMPLANT
SYR 20CC LL (SYRINGE) IMPLANT
SYR 20ML ECCENTRIC (SYRINGE) ×2 IMPLANT
TAPE CLOTH SURG 4X10 WHT LF (GAUZE/BANDAGES/DRESSINGS) ×2 IMPLANT
TOWEL OR 17X24 6PK STRL BLUE (TOWEL DISPOSABLE) ×2 IMPLANT
TOWEL OR 17X26 10 PK STRL BLUE (TOWEL DISPOSABLE) ×2 IMPLANT
TRAY FOLEY CATH 14FRSI W/METER (CATHETERS) ×2 IMPLANT
WATER STERILE IRR 1000ML POUR (IV SOLUTION) ×2 IMPLANT

## 2012-04-02 NOTE — H&P (Signed)
Subjective: The patient is a 36 year old white male who underwent a L4-5 and L5-S1 lumbar fusion by another physician in another town years ago. The patient has had persistent back and leg pain. 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 I discussed the treatment options with the patient and he elected to proceed with a redo L4-5-5 1 fusion. I perform the surgery about 2 weeks ago. Placing a larger screws in. The patient has developed left leg pain. I worked him up with a lumbar CT which demonstrated that the screw at S1 appears to protrude into the lateral recess. I discussed situation with the patient. We discussed the various treatment options including a revision of the left S1 screw. I explained the procedure, the risks, benefits and alternatives etc. I answered all the patient's questions he would like to proceed with the operation.   Past Medical History  Diagnosis Date  . Sciatica   . Chronic back pain   . GERD (gastroesophageal reflux disease)   . Arthritis     Past Surgical History  Procedure Date  . Neck surgery   . Knee surgery     bilateral ACL repair  . Anterior cervical decomp/discectomy fusion   . Shoulder arthroscopy     Left  . Back surgery     4 prior back surgeries    No Known Allergies  History  Substance Use Topics  . Smoking status: Current Every Day Smoker -- 0.5 packs/day for 23 years  . Smokeless tobacco: Not on file  . Alcohol Use: No    No family history on file. Prior to Admission medications   Medication Sig Start Date End Date Taking? Authorizing Provider  diazepam (VALIUM) 5 MG tablet Take 1 tablet (5 mg total) by mouth every 6 (six) hours as needed. 03/13/12  Yes Cristi Loron, MD  docusate sodium 100 MG CAPS Take 100 mg by mouth 2 (two) times daily. 03/13/12  Yes Cristi Loron, MD  escitalopram (LEXAPRO) 10 MG tablet Take 10 mg by mouth  daily.   Yes Historical Provider, MD  omeprazole (PRILOSEC) 20 MG capsule Take 20 mg by mouth daily.   Yes Historical Provider, MD  oxyCODONE-acetaminophen (PERCOCET) 10-325 MG per tablet Take 1 tablet by mouth every 4 (four) hours as needed for pain. 03/13/12  Yes Cristi Loron, MD  pregabalin (LYRICA) 200 MG capsule Take 200 mg by mouth 2 (two) times daily.   Yes Historical Provider, MD     Review of Systems  Positive ROS: As above  All other systems have been reviewed and were otherwise negative with the exception of those mentioned in the HPI and as above.  Objective: Vital signs in last 24 hours:    General Appearance: Alert, cooperative, no distress, appears stated age Head: Normocephalic, without obvious abnormality, atraumatic Eyes: PERRL, conjunctiva/corneas clear, EOM's intact, fundi benign, both eyes      Ears: Normal TM's and external ear canals, both ears Throat: Lips, mucosa, and tongue normal; teeth and gums normal Neck: Supple, symmetrical, trachea midline, no adenopathy; thyroid: No enlargement/tenderness/nodules; no carotid bruit or JVD Back: Symmetric, no curvature, ROM normal, no CVA tenderness. The patient's lumbar incision is healing well. Lungs: Clear to auscultation bilaterally, respirations unlabored Heart: Regular rate and rhythm, S1 and S2 normal, no murmur, rub or gallop Abdomen: Soft, non-tender, bowel sounds active all four quadrants, no masses, no  organomegaly Extremities: Extremities normal, atraumatic, no cyanosis or edema Pulses: 2+ and symmetric all extremities Skin: Skin color, texture, turgor normal, no rashes or lesions  NEUROLOGIC:   Mental status: alert and oriented, no aphasia, good attention span, Fund of knowledge/ memory ok Motor Exam - grossly normal Sensory Exam - grossly normal Reflexes: Symmetric Coordination - grossly normal Gait - grossly normal Balance - grossly normal Cranial Nerves: I: smell Not tested  II: visual acuity   OS: Normal    OD: Normal   II: visual fields Full to confrontation  II: pupils Equal, round, reactive to light  III,VII: ptosis None  III,IV,VI: extraocular muscles  Full ROM  V: mastication Normal  V: facial light touch sensation  Normal  V,VII: corneal reflex  Present  VII: facial muscle function - upper  Normal  VII: facial muscle function - lower Normal  VIII: hearing Not tested  IX: soft palate elevation  Normal  IX,X: gag reflex Present  XI: trapezius strength  5/5  XI: sternocleidomastoid strength 5/5  XI: neck flexion strength  5/5  XII: tongue strength  Normal    Data Review Lab Results  Component Value Date   WBC 10.4 04/01/2012   HGB 14.2 04/01/2012   HCT 40.8 04/01/2012   MCV 92.3 04/01/2012   PLT 300 04/01/2012   Lab Results  Component Value Date   NA 134* 04/01/2012   K 4.1 04/01/2012   CL 99 04/01/2012   CO2 25 04/01/2012   BUN 10 04/01/2012   CREATININE 0.79 04/01/2012   GLUCOSE 114* 04/01/2012   No results found for this basename: INR, PROTIME    Assessment/Plan: Displaced left S1 pedicle screw, left S1 radiculopathy, lumbago: I discussed situation with the patient. I reviewed his CAT scan with them and pointed out the abnormalities. We have discussed the various treatment options including surgery. I described the treatment option of a revision of his left S1 pedicle screw. I described the surgery to the patient. I've shown him surgical models. I discussed the risks, benefits, alternatives, and likelihood of achieving our goals with surgery. I answered all the patient's questions. He has decided proceed with the operation.   Nikole Swartzentruber D 04/02/2012 2:01 PM

## 2012-04-02 NOTE — Transfer of Care (Signed)
Immediate Anesthesia Transfer of Care Note  Patient: Hunter Bradshaw  Procedure(s) Performed: Procedure(s) (LRB) with comments: POSTERIOR LUMBAR FUSION 1 WITH HARDWARE REMOVAL (N/A) - revision of LEFT Sacral one pedicle screw  Patient Location: PACU  Anesthesia Type:General  Level of Consciousness: awake, alert  and patient cooperative  Airway & Oxygen Therapy: Patient Spontanous Breathing and Patient connected to face mask oxygen  Post-op Assessment: Report given to PACU RN, Post -op Vital signs reviewed and stable and Patient moving all extremities  Post vital signs: Reviewed and stable  Complications: No apparent anesthesia complications

## 2012-04-02 NOTE — Anesthesia Procedure Notes (Signed)
Procedure Name: Intubation Date/Time: 04/02/2012 4:24 PM Performed by: Elon Alas Pre-anesthesia Checklist: Patient identified, Timeout performed, Emergency Drugs available, Suction available and Patient being monitored Patient Re-evaluated:Patient Re-evaluated prior to inductionOxygen Delivery Method: Circle system utilized Preoxygenation: Pre-oxygenation with 100% oxygen Intubation Type: IV induction Ventilation: Mask ventilation without difficulty Laryngoscope Size: Mac and 4 Grade View: Grade II Tube type: Oral Number of attempts: 1 Airway Equipment and Method: Stylet Placement Confirmation: positive ETCO2,  ETT inserted through vocal cords under direct vision and breath sounds checked- equal and bilateral Secured at: 22 cm Tube secured with: Tape Dental Injury: Teeth and Oropharynx as per pre-operative assessment

## 2012-04-02 NOTE — Preoperative (Signed)
Beta Blockers   Reason not to administer Beta Blockers:Not Applicable 

## 2012-04-02 NOTE — Anesthesia Preprocedure Evaluation (Addendum)
Anesthesia Evaluation  Patient identified by MRN, date of birth, ID band Patient awake    Reviewed: Allergy & Precautions, H&P , NPO status , Patient's Chart, lab work & pertinent test results  Airway Mallampati: I TM Distance: >3 FB Neck ROM: full    Dental  (+) Dental Advisory Given   Pulmonary Current Smoker,          Cardiovascular Rhythm:regular Rate:Normal     Neuro/Psych  Neuromuscular disease    GI/Hepatic GERD-  Medicated,  Endo/Other    Renal/GU      Musculoskeletal   Abdominal   Peds  Hematology   Anesthesia Other Findings   Reproductive/Obstetrics                         Anesthesia Physical Anesthesia Plan  ASA: II  Anesthesia Plan: General   Post-op Pain Management:    Induction: Intravenous  Airway Management Planned: Oral ETT  Additional Equipment:   Intra-op Plan:   Post-operative Plan: Extubation in OR  Informed Consent: I have reviewed the patients History and Physical, chart, labs and discussed the procedure including the risks, benefits and alternatives for the proposed anesthesia with the patient or authorized representative who has indicated his/her understanding and acceptance.     Plan Discussed with: CRNA, Anesthesiologist and Surgeon  Anesthesia Plan Comments:         Anesthesia Quick Evaluation

## 2012-04-02 NOTE — Progress Notes (Signed)
Subjective:  The patient is somnolent but easily arousable. He looks well. He is in no apparent distress.  Objective: Vital signs in last 24 hours: Temp:  [97.6 F (36.4 C)] 97.6 F (36.4 C) (11/21 1420) Pulse Rate:  [93] 93  (11/21 1420) Resp:  [16] 16  (11/21 1420) BP: (151)/(92) 151/92 mmHg (11/21 1420) SpO2:  [96 %] 96 % (11/21 1420)  Intake/Output from previous day:   Intake/Output this shift: Total I/O In: 1700 [I.V.:1700] Out: 150 [Blood:150]  Physical exam the patient is moving his lower extremities well.  Lab Results:  [facility] 04/01/12 1048  WBC 10.4  HGB 14.2  HCT 40.8  PLT 300   BMET  Basename 04/01/12 1048  NA 134*  K 4.1  CL 99  CO2 25  GLUCOSE 114*  BUN 10  CREATININE 0.79  CALCIUM 8.9    Studies/Results: No results found.  Assessment/Plan: The patient is doing well.  LOS: 0 days     Debborah Alonge D 04/02/2012, 6:42 PM

## 2012-04-02 NOTE — Op Note (Signed)
Brief history: The patient is a 37 year old white male who has had a previous L4-5 and L5-S1 instrumentation fusion by another physician in another city. He has had chronic back pain. I worked him up further with a lumbar MRI and lumbar myelo CT. This demonstrated findings consistent with a lumbar pseudoarthrosis. I discussed situation with the patient we discussed the various treatment options. The patient elected to proceed with a redo L5-S1 and L4-5 fusion, instrumentation. A perform a surgery about 2 weeks ago. The patient initially did well but about a week after surgery began complaining of left leg pain. I treated him medically his symptoms improved but he continues to have some left leg pain. I worked him up further with a lumbar CT which demonstrated the left S1 screw was a bit medial. I discussed the treatment options with the patient including an exploration of his lumbar fusion with revision of his left-sided S1 pedicle screw. I explained the surgery as well as the risks, benefits, and alternatives. The patient decided proceed with the operation.  Preop diagnosis: Left lumbar radiculopathy, lumbago  Postop diagnosis: The same  Procedure: Exploration of lumbar fusion with revision of his left S1 pedicle screw and rod.  Surgeon: Dr. Delma Officer  Assistant: Dr. Maeola Harman  Anesthesia: Gen. endotracheal  Estimated blood loss: 75 cc  Specimens: None  Drains: One epidural 10 mm flat Jackson-Pratt drain  Complications: None  Description of procedure: The patient was brought to the operating room by the anesthesia team. General endotracheal anesthesia was induced. The patient was turned to the prone position on the Wilson frame. His lumbosacral region was then prepared with Betadine scrub and Betadine solution. Sterile drapes were applied. I then injected the area to be incised with Marcaine with epinephrine solution. I used a scalpel to make an incision through his previous surgical  scar. I used a cautery perform a bilateral subperiosteal dissection exposing the old hardware. We inserted the versa track retractor for exposure.  I then carefully dissected through the epidural scar tissue at L5-S1 on the left. Identified the S1 nerve root. I performed a foraminotomy about the S1 nerve root. The nerve root itself appeared normal., I.e., there were no dural tears, spinal fluid leak, or other apparent injury to the S1 nerve root. I dissected out into the lateral recess and did see that the left S1 pedicle screw did have a cortical breaches in the left medial pedicle and it protruded into the lateral recess a few millimeters. It did however not appear to cause significant neural compression. None the less, I removed the caps from the left-sided screws, disconnected the cross connector, remove the rod, and removed the left S1 pedicle screw. Under fluoroscopic guidance I used a bone probe to create a tract more lateral. I removed the bone probe and used a ball probe to probe inside the tract there were no cortical breaches. I then tapped along the tract. I then reinserted the left S1 pedicle screw in a more lateral direction. We got good bony purchase. I then inspected left S1 nerve root and the screw was nowhere near the nerve root at this point. I then replaced the rod and secured in place with the caps which were tightened appropriately we then replaced cross connector and again tightened appropriately. We then obtained hemostasis using bipolar electrocautery. We  irrigated the wound out with bacitracin solution. I then placed a 10 mm flat Jackson-Pratt drain in the epidural space and tunneled out through separate stab  wound. I then removed the retractor. I then reapproximated the patient's lack lumbar fascia with interrupted #1 Vicryl suture, we reapproximated the subcutaneous tissue with interrupted 2-0 Vicryl suture. I then reapproximated the skin with Steri-Strips and benzoin. The wound was  then coated with bacitracin ointment. A sterile dressing was applied. The drapes were removed. The patient was subsequently returned to the supine position where he was extubated by the anesthesia team. He was then transported to the post anesthesia care unit in stable condition. By report all sponge instrument and needle counts were correct at the end this case.

## 2012-04-02 NOTE — Anesthesia Postprocedure Evaluation (Signed)
  Anesthesia Post-op Note  Patient: Hunter Bradshaw  Procedure(s) Performed: Procedure(s) (LRB) with comments: POSTERIOR LUMBAR FUSION 1 WITH HARDWARE REMOVAL (N/A) - revision of LEFT Sacral one pedicle screw  Patient Location: PACU  Anesthesia Type:General  Level of Consciousness: awake  Airway and Oxygen Therapy: Patient Spontanous Breathing  Post-op Pain: mild  Post-op Assessment: Post-op Vital signs reviewed, Patient's Cardiovascular Status Stable, Respiratory Function Stable, Patent Airway, No signs of Nausea or vomiting and Pain level controlled  Post-op Vital Signs: stable  Complications: No apparent anesthesia complications

## 2012-04-03 MED ORDER — DIAZEPAM 5 MG PO TABS: 5.0000 mg | ORAL_TABLET | Freq: Four times a day (QID) | ORAL | Status: DC | PRN

## 2012-04-03 MED ORDER — OXYCODONE-ACETAMINOPHEN 10-325 MG PO TABS: 1.0000 | ORAL_TABLET | ORAL | Status: DC | PRN

## 2012-04-03 NOTE — Progress Notes (Signed)
PCA discontinued.  Pt discharge orders received. Discharge education completed and pt is to be d/c home with wife.

## 2012-04-03 NOTE — Discharge Summary (Signed)
Physician Discharge Summary  Patient ID: [name] MRN: [id number] DOB/AGE: 01-02-1976 36 y.o.  Admit date: 04/02/2012 Discharge date: 04/03/2012  Admission Diagnoses: Lumbar radiculopathy, lumbago,  Discharge Diagnoses: The same Active Problems:  * No active hospital problems. *    Discharged Condition: good  Hospital Course: I admitted the patient to [facility] 04/02/2012 with a diagnosis of a lumbar radiculopathy. I performed an exploration of his lumbar fusion as well as revision of his left S1 pedicle screw. The surgery went well.  The patient's postoperative course was unremarkable and by postop day #1 the patient was requesting discharge to home. He says his leg pain is gone. He was giving written and oral discharge instructions all his questions were answered.  Consults: None Significant Diagnostic Studies: None Treatments: Exploration of lumbar fusion with revision of the left S1 pedicle screw. Discharge Exam: Blood pressure 140/86, pulse 73, temperature 98.1 F (36.7 C), temperature source Oral, resp. rate 14, height 5\' 9"  (1.753 m), weight 99.3 kg (218 lb 14.7 oz), SpO2 99.00%. The patient is alert and oriented. His strength is normal his lower extremities. His dressing is clean and dry. He looks well.  Disposition: Home  Discharge Orders    Future Orders Please Complete By Expires   Diet - low sodium heart healthy      Increase activity slowly      Discharge instructions      Comments:   Call [phone] for a followup appointment.   Remove dressing in 48 hours      Call MD for:  temperature >100.4      Call MD for:  persistant nausea and vomiting      Call MD for:  severe uncontrolled pain      Call MD for:  redness, tenderness, or signs of infection (pain, swelling, redness, odor or green/yellow discharge around incision site)      Call MD for:  difficulty breathing, headache or visual disturbances      Call MD for:  hives      Call MD for:   persistant dizziness or light-headedness      Call MD for:  extreme fatigue          Medication List     As of 04/03/2012  7:34 AM    TAKE these medications         diazepam 5 MG tablet   Commonly known as: VALIUM   Take 1 tablet (5 mg total) by mouth every 6 (six) hours as needed.      diazepam 5 MG tablet   Commonly known as: VALIUM   Take 1 tablet (5 mg total) by mouth every 6 (six) hours as needed.      DSS 100 MG Caps   Take 100 mg by mouth 2 (two) times daily.      escitalopram 10 MG tablet   Commonly known as: LEXAPRO   Take 10 mg by mouth daily.      omeprazole 20 MG capsule   Commonly known as: PRILOSEC   Take 20 mg by mouth daily.      oxyCODONE-acetaminophen 10-325 MG per tablet   Commonly known as: PERCOCET   Take 1 tablet by mouth every 4 (four) hours as needed for pain.      oxyCODONE-acetaminophen 10-325 MG per tablet   Commonly known as: PERCOCET   Take 1 tablet by mouth every 4 (four) hours as needed for pain.      pregabalin 200 MG  capsule   Commonly known as: LYRICA   Take 200 mg by mouth 2 (two) times daily.         SignedCristi Loron 04/03/2012, 7:34 AM

## 2012-04-03 NOTE — Progress Notes (Signed)
Orthopedic Tech Progress Note Patient Details:  Hunter Bradshaw 12/17/1975 [id number]  Patient ID: Hunter Bradshaw, male   DOB: 1976-03-14, 36 y.o.   MRN: [id number]   Hunter Bradshaw 04/03/2012, 8:40 AM Called [employer] for lumbar fusion brace.

## 2012-04-03 NOTE — Evaluation (Signed)
Occupational Therapy Evaluation and Discharge Patient Details Name: Hunter Bradshaw MRN: [id number] DOB: 12-12-1975 Today's Date: 04/03/2012 Time: 8413-2440 OT Time Calculation (min): 17 min  OT Assessment / Plan / Recommendation Clinical Impression  This 36 yo male s/p redo of back surgery from a little over a month ago presents to acute OT with all education completed. No further OT or PT needs noted (spoke to [place], PT about this). Acute OT will sign off.    OT Assessment  Patient does not need any further OT services    Follow Up Recommendations  No OT follow up       Equipment Recommendations  None recommended by OT;None recommended by PT          Precautions / Restrictions Precautions Precautions: Back Precaution Comments: Did not give handout due to pt states he has one from his last back surgery less than a month ago Required Braces or Orthoses: Spinal Brace Spinal Brace: Lumbar corset;Applied in sitting position Restrictions Weight Bearing Restrictions: No   Pertinent Vitals/Pain none    ADL  ADL Comments: Pt is I to Mod I with all BADLs and mobility (sit to stand and stand to sit as well as ambulation without AD), pt has ramped entrance at home so no need to practice steps. Pt can cross one leg over other to get to feet.      Visit Information  Last OT Received On: 04/03/12 Assistance Needed: +1    Subjective Data  Subjective: This is my 4th back surgery Patient Stated Goal: I am ready to go home   Prior Functioning     Home Living Lives With: Significant other Available Help at Discharge: Family;Available 24 hours/day Type of Home: Mobile home Home Access: Ramped entrance Home Layout: One level Bathroom Shower/Tub: Health visitor: Standard Home Adaptive Equipment: Bedside commode/3-in-1;Straight cane;Walker - rolling Additional Comments: Pt states he did not need any type of seat for shower Prior Function Level of Independence:  Independent Able to Take Stairs?: Yes Driving: No Vocation: On disability Communication Communication: No difficulties Dominant Hand: Right            Cognition  Overall Cognitive Status: Appears within functional limits for tasks assessed/performed Arousal/Alertness: Awake/alert Orientation Level: Appears intact for tasks assessed Behavior During Session: [facility] for tasks performed    Extremity/Trunk Assessment Right Upper Extremity Assessment RUE ROM/Strength/Tone: Within functional levels Left Upper Extremity Assessment LUE ROM/Strength/Tone: Within functional levels     Mobility Bed Mobility Details for Bed Mobility Assistance: Pt able to roll left at Independent level and come up to sit at independent level. Transfers Transfers: Sit to Stand;Stand to Sit Details for Transfer Assistance: No VC's need for safety with sit to stand or stand to sit.              End of Session OT - End of Session Equipment Utilized During Treatment: Back brace Activity Tolerance: Patient tolerated treatment well Patient left: in chair;with family/visitor present;with call bell/phone within reach Nurse Communication:  (Pt ready to D/C home)       Evette Georges [phone] 04/03/2012, 10:18 AM

## 2012-04-03 NOTE — Care Management Note (Signed)
    Page 1 of 1   04/03/2012     1:07:43 PM   CARE MANAGEMENT NOTE 04/03/2012  Patient:  Hunter Bradshaw, Hunter Bradshaw   Account Number:  [id number]  Date Initiated:  04/03/2012  Documentation initiated by:  Jacquelynn Cree  Subjective/Objective Assessment:   Admitted postop explortion lumbar fusionwith revision S1screw.     Action/Plan:   PT/OT evals-no follow up recommended   Anticipated DC Date:  04/03/2012   Anticipated DC Plan:  HOME/SELF CARE      DC Planning Services  CM consult      Choice offered to / List presented to:             Status of service:  Completed, signed off Medicare Important Message given?   (If response is "NO", the following Medicare IM given date fields will be blank) Date Medicare IM given:   Date Additional Medicare IM given:    Discharge Disposition:  HOME/SELF CARE  Per UR Regulation:  Reviewed for med. necessity/level of care/duration of stay  If discussed at Long Length of Stay Meetings, dates discussed:    Comments:

## 2012-08-24 ENCOUNTER — Emergency Department (HOSPITAL_COMMUNITY)
Admission: EM | Admit: 2012-08-24 | Discharge: 2012-08-24 | Disposition: A | Discharge status: Home / Self Care | Payer: Medicaid Other | Attending: Emergency Medicine | Admitting: Emergency Medicine

## 2012-08-24 ENCOUNTER — Emergency Department (HOSPITAL_COMMUNITY): Payer: Medicaid Other

## 2012-08-24 ENCOUNTER — Encounter (HOSPITAL_COMMUNITY): Payer: Self-pay | Admitting: *Deleted

## 2012-08-24 DIAGNOSIS — Z8739 Personal history of other diseases of the musculoskeletal system and connective tissue: Secondary | ICD-10-CM | POA: Insufficient documentation

## 2012-08-24 DIAGNOSIS — M5416 Radiculopathy, lumbar region: Secondary | ICD-10-CM

## 2012-08-24 DIAGNOSIS — Z9889 Other specified postprocedural states: Secondary | ICD-10-CM | POA: Insufficient documentation

## 2012-08-24 DIAGNOSIS — IMO0002 Reserved for concepts with insufficient information to code with codable children: Secondary | ICD-10-CM | POA: Insufficient documentation

## 2012-08-24 DIAGNOSIS — F17200 Nicotine dependence, unspecified, uncomplicated: Secondary | ICD-10-CM | POA: Insufficient documentation

## 2012-08-24 DIAGNOSIS — G8929 Other chronic pain: Secondary | ICD-10-CM | POA: Insufficient documentation

## 2012-08-24 DIAGNOSIS — Z79899 Other long term (current) drug therapy: Secondary | ICD-10-CM | POA: Insufficient documentation

## 2012-08-24 DIAGNOSIS — K219 Gastro-esophageal reflux disease without esophagitis: Secondary | ICD-10-CM | POA: Insufficient documentation

## 2012-08-24 DIAGNOSIS — M129 Arthropathy, unspecified: Secondary | ICD-10-CM | POA: Insufficient documentation

## 2012-08-24 MED ORDER — PREDNISONE 20 MG PO TABS: ORAL_TABLET | ORAL | Status: DC

## 2012-08-24 MED ORDER — OXYCODONE-ACETAMINOPHEN 5-325 MG PO TABS: 2.0000 | ORAL_TABLET | ORAL | Status: DC | PRN

## 2012-08-24 NOTE — ED Provider Notes (Signed)
History     CSN: [id number]  Arrival date & time 08/24/12  1328   First MD Initiated Contact with Patient 08/24/12 1336      Chief Complaint  Patient presents with  . Back Pain    (Consider location/radiation/quality/duration/timing/severity/associated sxs/prior treatment) HPI Comments: Patient comes to the emergency department with complaints of low back pain. Patient reports that he was moving hay this morning and felt a pop in the left lower back. Patient is now having a burning pain in the left posterior hip area going down his leg. It hurts when he walks, but he is able to walk. No weakness, numbness or tingling. No change in bowel or bladder function. Patient does report that he has had 5 surgeries on his neck and back in the past.  Patient is a 36 y.o. male presenting with back pain.  Back Pain   Past Medical History  Diagnosis Date  . Sciatica   . Chronic back pain   . GERD (gastroesophageal reflux disease)   . Arthritis     Past Surgical History  Procedure Laterality Date  . Neck surgery    . Knee surgery      bilateral ACL repair  . Anterior cervical decomp/discectomy fusion    . Shoulder arthroscopy      Left  . Back surgery      4 prior back surgeries    History reviewed. No pertinent family history.  History  Substance Use Topics  . Smoking status: Current Every Day Smoker -- 0.50 packs/day for 23 years  . Smokeless tobacco: Not on file  . Alcohol Use: No      Review of Systems  Musculoskeletal: Positive for back pain.  Neurological: Negative.   All other systems reviewed and are negative.    Allergies  Review of patient's allergies indicates no known allergies.  Home Medications   Current Outpatient Rx  Name  Route  Sig  Dispense  Refill  . diazepam (VALIUM) 5 MG tablet   Oral   Take 1 tablet (5 mg total) by mouth every 6 (six) hours as needed.   50 tablet   1   . diazepam (VALIUM) 5 MG tablet   Oral   Take 1 tablet (5 mg total)  by mouth every 6 (six) hours as needed.   50 tablet   1   . docusate sodium 100 MG CAPS   Oral   Take 100 mg by mouth 2 (two) times daily.   60 capsule   1   . escitalopram (LEXAPRO) 10 MG tablet   Oral   Take 10 mg by mouth daily.         Marland Kitchen omeprazole (PRILOSEC) 20 MG capsule   Oral   Take 20 mg by mouth daily.         Marland Kitchen oxyCODONE-acetaminophen (PERCOCET) 10-325 MG per tablet   Oral   Take 1 tablet by mouth every 4 (four) hours as needed for pain.   100 tablet   0   . oxyCODONE-acetaminophen (PERCOCET) 10-325 MG per tablet   Oral   Take 1 tablet by mouth every 4 (four) hours as needed for pain.   100 tablet   0   . pregabalin (LYRICA) 200 MG capsule   Oral   Take 200 mg by mouth 2 (two) times daily.           BP 177/109  Pulse 100  Temp(Src) 97 F (36.1 C)  Resp 20  Ht 5'  9" (1.753 m)  Wt 235 lb (106.595 kg)  BMI 34.69 kg/m2  SpO2 98%  Physical Exam  Constitutional: He is oriented to person, place, and time. He appears well-developed and well-nourished. No distress.  HENT:  Head: Normocephalic and atraumatic.  Right Ear: Hearing normal.  Nose: Nose normal.  Mouth/Throat: Oropharynx is clear and moist and mucous membranes are normal.  Eyes: Conjunctivae and EOM are normal. Pupils are equal, round, and reactive to light.  Neck: Normal range of motion. Neck supple.  Cardiovascular: Normal rate, regular rhythm, S1 normal and S2 normal.  Exam reveals no gallop and no friction rub.   No murmur heard. Pulmonary/Chest: Effort normal and breath sounds normal. No respiratory distress. He exhibits no tenderness.  Abdominal: Soft. Normal appearance and bowel sounds are normal. There is no hepatosplenomegaly. There is no tenderness. There is no rebound, no guarding, no tenderness at McBurney's point and negative Murphy's sign. No hernia.  Musculoskeletal: Normal range of motion.       Left hip: He exhibits normal range of motion and normal strength.       Lumbar  back: He exhibits no tenderness.       Back:  Neurological: He is alert and oriented to person, place, and time. He has normal strength. No cranial nerve deficit or sensory deficit. Coordination normal. GCS eye subscore is 4. GCS verbal subscore is 5. GCS motor subscore is 6.  Reflex Scores:      Patellar reflexes are 2+ on the right side and 2+ on the left side. Skin: Skin is warm, dry and intact. No rash noted. No cyanosis.  Psychiatric: He has a normal mood and affect. His speech is normal and behavior is normal. Thought content normal.    ED Course  Procedures (including critical care time)  Labs Reviewed - No data to display No results found.   Diagnosis: Lumbar radiculopathy    MDM   Patient presents to the ER with acute onset of increased low back pain after moving hay bales. Patient has a history of chronic back pain with multiple surgeries. Neurologic function is normal here in the ER. X-ray does not show any disruption of the patient's hardware. Patient treated with analgesia and corticosteroids, followup with Dr. Lovell Sheehan, his back surgeon.    I personally performed the services described in this documentation, which was scribed in my presence. The recorded information has been reviewed and is accurate.        Gilda Crease, MD 08/24/12 [phone]

## 2012-08-24 NOTE — ED Notes (Signed)
Pt states that left leg is tingling and that this is normal for him due to back pain. Sensation and temperature are good in both feet. Patient states that right arch of foot is tingling and that this is not normal for him. Movement and sensation is good in both feet.

## 2012-08-24 NOTE — ED Notes (Signed)
Moving hay this morning and felt something pop in his back.

## 2012-09-02 ENCOUNTER — Emergency Department (HOSPITAL_COMMUNITY)
Admission: EM | Admit: 2012-09-02 | Discharge: 2012-09-02 | Disposition: A | Discharge status: Home / Self Care | Payer: Medicaid Other | Attending: Emergency Medicine | Admitting: Emergency Medicine

## 2012-09-02 ENCOUNTER — Emergency Department (HOSPITAL_COMMUNITY): Payer: Medicaid Other

## 2012-09-02 ENCOUNTER — Encounter (HOSPITAL_COMMUNITY): Payer: Self-pay

## 2012-09-02 DIAGNOSIS — K219 Gastro-esophageal reflux disease without esophagitis: Secondary | ICD-10-CM | POA: Insufficient documentation

## 2012-09-02 DIAGNOSIS — X500XXA Overexertion from strenuous movement or load, initial encounter: Secondary | ICD-10-CM | POA: Insufficient documentation

## 2012-09-02 DIAGNOSIS — Y929 Unspecified place or not applicable: Secondary | ICD-10-CM | POA: Insufficient documentation

## 2012-09-02 DIAGNOSIS — Z8739 Personal history of other diseases of the musculoskeletal system and connective tissue: Secondary | ICD-10-CM | POA: Insufficient documentation

## 2012-09-02 DIAGNOSIS — I10 Essential (primary) hypertension: Secondary | ICD-10-CM | POA: Insufficient documentation

## 2012-09-02 DIAGNOSIS — Y9389 Activity, other specified: Secondary | ICD-10-CM | POA: Insufficient documentation

## 2012-09-02 DIAGNOSIS — G8929 Other chronic pain: Secondary | ICD-10-CM | POA: Insufficient documentation

## 2012-09-02 DIAGNOSIS — Z9889 Other specified postprocedural states: Secondary | ICD-10-CM | POA: Insufficient documentation

## 2012-09-02 DIAGNOSIS — Z79899 Other long term (current) drug therapy: Secondary | ICD-10-CM | POA: Insufficient documentation

## 2012-09-02 DIAGNOSIS — S6990XA Unspecified injury of unspecified wrist, hand and finger(s), initial encounter: Secondary | ICD-10-CM | POA: Insufficient documentation

## 2012-09-02 DIAGNOSIS — F17200 Nicotine dependence, unspecified, uncomplicated: Secondary | ICD-10-CM | POA: Insufficient documentation

## 2012-09-02 DIAGNOSIS — IMO0002 Reserved for concepts with insufficient information to code with codable children: Secondary | ICD-10-CM | POA: Insufficient documentation

## 2012-09-02 DIAGNOSIS — S59801A Other specified injuries of right elbow, initial encounter: Secondary | ICD-10-CM

## 2012-09-02 DIAGNOSIS — S59909A Unspecified injury of unspecified elbow, initial encounter: Secondary | ICD-10-CM | POA: Insufficient documentation

## 2012-09-02 HISTORY — DX: Essential (primary) hypertension: I10

## 2012-09-02 MED ORDER — OXYCODONE-ACETAMINOPHEN 5-325 MG PO TABS: 2.0000 | ORAL_TABLET | ORAL | Status: DC | PRN

## 2012-09-02 MED ORDER — OXYCODONE-ACETAMINOPHEN 5-325 MG PO TABS
1.0000 | ORAL_TABLET | Freq: Once | ORAL | Status: AC
  Administered 2012-09-02: 1 via ORAL
  Filled 2012-09-02: qty 1

## 2012-09-02 MED ORDER — PREDNISONE (PAK) 10 MG PO TABS: 10.0000 mg | ORAL_TABLET | Freq: Every day | ORAL | Status: DC

## 2012-09-02 NOTE — ED Notes (Signed)
Pt reports hurt r arm picking up 50lb bag.  Reports r arm twisted and has had pain and swelling to r elbow since then.

## 2012-09-02 NOTE — ED Provider Notes (Signed)
History     CSN: [id number]  Arrival date & time 09/02/12  1118   First MD Initiated Contact with Patient 09/02/12 1128      Chief Complaint  Patient presents with  . Arm Pain    (Consider location/radiation/quality/duration/timing/severity/associated sxs/prior treatment) HPI Hunter Bradshaw is a 37 y.o. male who presents to the ED with arm pain . He states that he was picking up a 50 pound bag and hyperextended and  twisted his elbow. Has had pain since the injury yesterday. He is right handed. He denies any other injuries. The history was provided by the patient.  Past Medical History  Diagnosis Date  . Sciatica   . Chronic back pain   . GERD (gastroesophageal reflux disease)   . Arthritis   . Hypertension     Past Surgical History  Procedure Laterality Date  . Neck surgery    . Knee surgery      bilateral ACL repair  . Anterior cervical decomp/discectomy fusion    . Shoulder arthroscopy      Left  . Back surgery      4 prior back surgeries    No family history on file.  History  Substance Use Topics  . Smoking status: Current Every Day Smoker -- 0.50 packs/day for 23 years  . Smokeless tobacco: Not on file  . Alcohol Use: No      Review of Systems  Constitutional: Negative for fever and chills.  HENT: Negative for neck pain.   Respiratory: Negative for chest tightness.   Cardiovascular: Negative for chest pain.  Musculoskeletal:       Right elbow pain and swelling  Skin: Negative for wound.  Neurological: Negative for dizziness and headaches.  Psychiatric/Behavioral: The patient is not nervous/anxious.     Allergies  Review of patient's allergies indicates no known allergies.  Home Medications   Current Outpatient Rx  Name  Route  Sig  Dispense  Refill  . escitalopram (LEXAPRO) 10 MG tablet   Oral   Take 10 mg by mouth daily.         Marland Kitchen omeprazole (PRILOSEC) 20 MG capsule   Oral   Take 20 mg by mouth daily.         Marland Kitchen oxyCODONE  (ROXICODONE) 15 MG immediate release tablet   Oral   Take 15 mg by mouth every 4 (four) hours as needed for pain.         Marland Kitchen oxyCODONE-acetaminophen (PERCOCET) 5-325 MG per tablet   Oral   Take 2 tablets by mouth every 4 (four) hours as needed for pain.   10 tablet   0   . predniSONE (DELTASONE) 20 MG tablet      3 tabs po daily x 3 days, then 2 tabs x 3 days, then 1.5 tabs x 3 days, then 1 tab x 3 days, then 0.5 tabs x 3 days   27 tablet   0   . pregabalin (LYRICA) 200 MG capsule   Oral   Take 200 mg by mouth 2 (two) times daily.           BP 148/104  Pulse 74  Temp(Src) 97.7 F (36.5 C) (Oral)  Resp 16  Ht 5\' 9"  (1.753 m)  Wt 238 lb (107.956 kg)  BMI 35.13 kg/m2  SpO2 100%  Physical Exam  Nursing note and vitals reviewed. Constitutional: He is oriented to person, place, and time. He appears well-developed and well-nourished. No distress.  HENT:  Head:  Normocephalic.  Eyes: EOM are normal.  Neck: Neck supple.  Pulmonary/Chest: Effort normal.  Abdominal: Soft. There is no tenderness.  Musculoskeletal:       Right elbow: He exhibits decreased range of motion and swelling. Tenderness found. Radial head and olecranon process tenderness noted.  There is tenderness, swelling noted from the wrist to the upper arm. The area of the elbow is the main point of tenderness and swelling. Radial pulse is strong, adequate circulation and good touch sensation.   Neurological: He is alert and oriented to person, place, and time. He has normal strength. No cranial nerve deficit or sensory deficit.  Skin: Skin is warm and dry.  Psychiatric: He has a normal mood and affect. His behavior is normal. Judgment and thought content normal.    ED Course  Procedures (including critical care time)  Labs Reviewed - No data to display Dg Elbow Complete Right  09/02/2012  *RADIOLOGY REPORT*  Clinical Data: Right elbow pain after injury.  RIGHT ELBOW - COMPLETE 3+ VIEW  Comparison: None.   Findings: No fracture or dislocation is identified.  No joint effusion is seen.  Tiny loose body is identified in the elbow joint.  Mild enthesopathic change at the common extensor tendon origin is noted.  IMPRESSION:  1.  No acute abnormality. 2.  Tiny loose bodies in the elbow joint.   Original Report Authenticated By: Holley Dexter, M.D.     Assessment: 37 y.o. male with pain to right elbow after hyperextension of the elbow  Plan:  Pain management, sling, steroids   Follow up with ortho   Return as needed MDM  Dr. Adriana Simas in to evaluate the patient. Discussed clinical and x-ray findings with the patient and plan of care.All questioned fully answered. He will return if problems arise.   Medication List    TAKE these medications       oxyCODONE-acetaminophen 5-325 MG per tablet  Commonly known as:  PERCOCET/ROXICET  Take 2 tablets by mouth every 4 (four) hours as needed for pain.     predniSONE 10 MG tablet  Commonly known as:  STERAPRED UNI-PAK  Take 1 tablet (10 mg total) by mouth daily. Take 6 tablets day one then, 5,4,3,2,1      ASK your doctor about these medications       escitalopram 10 MG tablet  Commonly known as:  LEXAPRO  Take 10 mg by mouth daily.     omeprazole 20 MG capsule  Commonly known as:  PRILOSEC  Take 20 mg by mouth daily.     oxyCODONE-acetaminophen 5-325 MG per tablet  Commonly known as:  PERCOCET  Take 2 tablets by mouth every 4 (four) hours as needed for pain.     pregabalin 200 MG capsule  Commonly known as:  LYRICA  Take 200 mg by mouth 2 (two) times daily.                [place], [place] 09/02/12 [phone]

## 2012-09-03 NOTE — ED Notes (Signed)
I callled Dr. Bari Edward office called to scedule Referral Appt., after Dr. Loel Dubonnet. Called and informed us that they do not take referrals.  They will set up appt and will call Pt to get other needed info.

## 2012-09-03 NOTE — ED Provider Notes (Signed)
Medical screening examination/treatment/procedure(s) were conducted as a shared visit with non-physician practitioner(s) and myself.  I personally evaluated the patient during the encounter.  History and physical consistent with tendinitis  Donnetta Hutching, MD 09/03/12 [phone]

## 2013-03-11 ENCOUNTER — Other Ambulatory Visit: Payer: Self-pay | Admitting: Neurosurgery

## 2013-03-11 DIAGNOSIS — M545 Low back pain: Secondary | ICD-10-CM

## 2013-03-18 ENCOUNTER — Ambulatory Visit
Admission: RE | Admit: 2013-03-18 | Discharge: 2013-03-18 | Disposition: A | Discharge status: Home / Self Care | Payer: Medicaid Other | Source: Ambulatory Visit | Attending: Neurosurgery | Admitting: Neurosurgery

## 2013-03-18 VITALS — BP 142/94 | HR 82

## 2013-03-18 DIAGNOSIS — M545 Low back pain: Secondary | ICD-10-CM

## 2013-03-18 MED ORDER — IOHEXOL 180 MG/ML  SOLN
17.0000 mL | Freq: Once | INTRAMUSCULAR | Status: AC | PRN
  Administered 2013-03-18: 17 mL via INTRATHECAL

## 2013-03-18 MED ORDER — DIAZEPAM 5 MG PO TABS
10.0000 mg | ORAL_TABLET | Freq: Once | ORAL | Status: AC
  Administered 2013-03-18: 10 mg via ORAL

## 2013-03-18 MED ORDER — ONDANSETRON HCL 4 MG/2ML IJ SOLN
4.0000 mg | Freq: Once | INTRAMUSCULAR | Status: AC
  Administered 2013-03-18: 4 mg via INTRAMUSCULAR

## 2013-03-18 MED ORDER — MEPERIDINE HCL 100 MG/ML IJ SOLN
100.0000 mg | Freq: Once | INTRAMUSCULAR | Status: AC
  Administered 2013-03-18: 100 mg via INTRAMUSCULAR

## 2013-03-18 NOTE — Progress Notes (Signed)
Pt states he has been off lexapro for the past 2 days. Discharge instructions explained to pt.

## 2013-04-05 ENCOUNTER — Encounter (HOSPITAL_COMMUNITY): Payer: Self-pay | Admitting: Emergency Medicine

## 2013-04-05 ENCOUNTER — Emergency Department (HOSPITAL_COMMUNITY)
Admission: EM | Admit: 2013-04-05 | Discharge: 2013-04-06 | Disposition: A | Discharge status: Home / Self Care | Payer: Medicaid Other | Attending: Emergency Medicine | Admitting: Emergency Medicine

## 2013-04-05 DIAGNOSIS — Z79899 Other long term (current) drug therapy: Secondary | ICD-10-CM | POA: Insufficient documentation

## 2013-04-05 DIAGNOSIS — F17200 Nicotine dependence, unspecified, uncomplicated: Secondary | ICD-10-CM | POA: Insufficient documentation

## 2013-04-05 DIAGNOSIS — Z9889 Other specified postprocedural states: Secondary | ICD-10-CM | POA: Insufficient documentation

## 2013-04-05 DIAGNOSIS — Z981 Arthrodesis status: Secondary | ICD-10-CM | POA: Insufficient documentation

## 2013-04-05 DIAGNOSIS — S8990XA Unspecified injury of unspecified lower leg, initial encounter: Secondary | ICD-10-CM | POA: Insufficient documentation

## 2013-04-05 DIAGNOSIS — M129 Arthropathy, unspecified: Secondary | ICD-10-CM | POA: Insufficient documentation

## 2013-04-05 DIAGNOSIS — Y9389 Activity, other specified: Secondary | ICD-10-CM | POA: Insufficient documentation

## 2013-04-05 DIAGNOSIS — IMO0002 Reserved for concepts with insufficient information to code with codable children: Secondary | ICD-10-CM | POA: Insufficient documentation

## 2013-04-05 DIAGNOSIS — K219 Gastro-esophageal reflux disease without esophagitis: Secondary | ICD-10-CM | POA: Insufficient documentation

## 2013-04-05 DIAGNOSIS — R52 Pain, unspecified: Secondary | ICD-10-CM

## 2013-04-05 DIAGNOSIS — I10 Essential (primary) hypertension: Secondary | ICD-10-CM | POA: Insufficient documentation

## 2013-04-05 DIAGNOSIS — Y92410 Unspecified street and highway as the place of occurrence of the external cause: Secondary | ICD-10-CM | POA: Insufficient documentation

## 2013-04-05 DIAGNOSIS — S0993XA Unspecified injury of face, initial encounter: Secondary | ICD-10-CM | POA: Insufficient documentation

## 2013-04-05 DIAGNOSIS — G8929 Other chronic pain: Secondary | ICD-10-CM | POA: Insufficient documentation
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
## 2013-04-06 NOTE — ED Provider Notes (Signed)
Medical screening examination/treatment/procedure(s) were performed by non-physician practitioner and as supervising physician I was immediately available for consultation/collaboration.  EKG Interpretation   None         Hanley Seamen, MD 04/06/13 [phone]
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
## 2013-04-06 NOTE — ED Notes (Signed)
Patient given discharge instruction, verbalized understand. Patient ambulatory out of the department.  

## 2013-04-12 ENCOUNTER — Encounter (HOSPITAL_COMMUNITY): Payer: Self-pay | Admitting: Emergency Medicine

## 2013-04-12 ENCOUNTER — Emergency Department (HOSPITAL_COMMUNITY): Payer: Medicaid Other

## 2013-04-12 DIAGNOSIS — I10 Essential (primary) hypertension: Secondary | ICD-10-CM | POA: Insufficient documentation

## 2013-04-12 DIAGNOSIS — K219 Gastro-esophageal reflux disease without esophagitis: Secondary | ICD-10-CM | POA: Insufficient documentation

## 2013-04-12 DIAGNOSIS — G8929 Other chronic pain: Secondary | ICD-10-CM | POA: Insufficient documentation

## 2013-04-12 DIAGNOSIS — Z791 Long term (current) use of non-steroidal anti-inflammatories (NSAID): Secondary | ICD-10-CM | POA: Insufficient documentation

## 2013-04-12 DIAGNOSIS — F17200 Nicotine dependence, unspecified, uncomplicated: Secondary | ICD-10-CM | POA: Insufficient documentation

## 2013-04-12 DIAGNOSIS — T7411XA Adult physical abuse, confirmed, initial encounter: Secondary | ICD-10-CM | POA: Insufficient documentation

## 2013-04-12 DIAGNOSIS — Z79899 Other long term (current) drug therapy: Secondary | ICD-10-CM | POA: Insufficient documentation

## 2013-04-12 DIAGNOSIS — S5010XA Contusion of unspecified forearm, initial encounter: Secondary | ICD-10-CM | POA: Insufficient documentation

## 2013-04-12 DIAGNOSIS — IMO0002 Reserved for concepts with insufficient information to code with codable children: Secondary | ICD-10-CM | POA: Insufficient documentation

## 2013-04-12 DIAGNOSIS — S40029A Contusion of unspecified upper arm, initial encounter: Secondary | ICD-10-CM | POA: Insufficient documentation

## 2013-04-12 DIAGNOSIS — M129 Arthropathy, unspecified: Secondary | ICD-10-CM | POA: Insufficient documentation
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
## 2013-04-13 ENCOUNTER — Emergency Department (HOSPITAL_COMMUNITY)
Admission: EM | Admit: 2013-04-13 | Discharge: 2013-04-13 | Disposition: A | Discharge status: Home / Self Care | Payer: Medicaid Other | Attending: Emergency Medicine | Admitting: Emergency Medicine

## 2013-04-13 DIAGNOSIS — S5012XA Contusion of left forearm, initial encounter: Secondary | ICD-10-CM

## 2013-04-13 DIAGNOSIS — S40812A Abrasion of left upper arm, initial encounter: Secondary | ICD-10-CM

## 2013-04-13 DIAGNOSIS — S50812A Abrasion of left forearm, initial encounter: Secondary | ICD-10-CM

## 2013-04-13 DIAGNOSIS — S40022A Contusion of left upper arm, initial encounter: Secondary | ICD-10-CM

## 2013-04-13 MED ORDER — OXYCODONE-ACETAMINOPHEN 5-325 MG PO TABS: 1.0000 | ORAL_TABLET | ORAL | Status: DC | PRN

## 2013-04-13 MED ORDER — OXYCODONE-ACETAMINOPHEN 5-325 MG PO TABS
1.0000 | ORAL_TABLET | Freq: Once | ORAL | Status: AC
  Administered 2013-04-13: 1 via ORAL
  Filled 2013-04-13: qty 1
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
## 2013-04-19 MED FILL — Oxycodone w/ Acetaminophen Tab 5-325 MG: ORAL | Qty: 6 | Status: AC

## 2013-09-08 ENCOUNTER — Emergency Department (HOSPITAL_COMMUNITY): Payer: Medicaid Other

## 2013-09-08 ENCOUNTER — Encounter (HOSPITAL_COMMUNITY): Payer: Self-pay | Admitting: Emergency Medicine

## 2013-09-08 ENCOUNTER — Emergency Department (HOSPITAL_COMMUNITY)
Admission: EM | Admit: 2013-09-08 | Discharge: 2013-09-08 | Disposition: A | Discharge status: Home / Self Care | Payer: Medicaid Other | Attending: Emergency Medicine | Admitting: Emergency Medicine

## 2013-09-08 DIAGNOSIS — Y9389 Activity, other specified: Secondary | ICD-10-CM | POA: Diagnosis not present

## 2013-09-08 DIAGNOSIS — K219 Gastro-esophageal reflux disease without esophagitis: Secondary | ICD-10-CM | POA: Insufficient documentation

## 2013-09-08 DIAGNOSIS — Y92410 Unspecified street and highway as the place of occurrence of the external cause: Secondary | ICD-10-CM | POA: Insufficient documentation

## 2013-09-08 DIAGNOSIS — S46909A Unspecified injury of unspecified muscle, fascia and tendon at shoulder and upper arm level, unspecified arm, initial encounter: Secondary | ICD-10-CM | POA: Insufficient documentation

## 2013-09-08 DIAGNOSIS — G8929 Other chronic pain: Secondary | ICD-10-CM | POA: Diagnosis not present

## 2013-09-08 DIAGNOSIS — Z79899 Other long term (current) drug therapy: Secondary | ICD-10-CM | POA: Insufficient documentation

## 2013-09-08 DIAGNOSIS — Z9889 Other specified postprocedural states: Secondary | ICD-10-CM | POA: Insufficient documentation

## 2013-09-08 DIAGNOSIS — IMO0002 Reserved for concepts with insufficient information to code with codable children: Secondary | ICD-10-CM | POA: Diagnosis not present

## 2013-09-08 DIAGNOSIS — Z981 Arthrodesis status: Secondary | ICD-10-CM | POA: Diagnosis not present

## 2013-09-08 DIAGNOSIS — I10 Essential (primary) hypertension: Secondary | ICD-10-CM | POA: Insufficient documentation

## 2013-09-08 DIAGNOSIS — M25512 Pain in left shoulder: Secondary | ICD-10-CM

## 2013-09-08 DIAGNOSIS — Z791 Long term (current) use of non-steroidal anti-inflammatories (NSAID): Secondary | ICD-10-CM | POA: Diagnosis not present

## 2013-09-08 DIAGNOSIS — R51 Headache: Secondary | ICD-10-CM | POA: Diagnosis not present

## 2013-09-08 DIAGNOSIS — M545 Low back pain: Secondary | ICD-10-CM

## 2013-09-08 DIAGNOSIS — F17200 Nicotine dependence, unspecified, uncomplicated: Secondary | ICD-10-CM | POA: Insufficient documentation

## 2013-09-08 DIAGNOSIS — S4980XA Other specified injuries of shoulder and upper arm, unspecified arm, initial encounter: Secondary | ICD-10-CM | POA: Diagnosis not present

## 2013-09-08 DIAGNOSIS — S199XXA Unspecified injury of neck, initial encounter: Secondary | ICD-10-CM

## 2013-09-08 DIAGNOSIS — S79919A Unspecified injury of unspecified hip, initial encounter: Secondary | ICD-10-CM | POA: Insufficient documentation

## 2013-09-08 DIAGNOSIS — M129 Arthropathy, unspecified: Secondary | ICD-10-CM | POA: Diagnosis not present

## 2013-09-08 DIAGNOSIS — S79929A Unspecified injury of unspecified thigh, initial encounter: Secondary | ICD-10-CM

## 2013-09-08 DIAGNOSIS — S0993XA Unspecified injury of face, initial encounter: Secondary | ICD-10-CM | POA: Insufficient documentation

## 2013-09-08 LAB — ETHANOL: Alcohol, Ethyl (B): 187 mg/dL — ABNORMAL HIGH (ref 0–11)

## 2013-09-08 MED ORDER — KETOROLAC TROMETHAMINE 30 MG/ML IJ SOLN
30.0000 mg | Freq: Once | INTRAMUSCULAR | Status: AC
  Administered 2013-09-08: 30 mg via INTRAVENOUS
  Filled 2013-09-08: qty 1

## 2013-09-08 MED ORDER — NAPROXEN 500 MG PO TABS: 500.0000 mg | ORAL_TABLET | Freq: Two times a day (BID) | ORAL | Status: DC

## 2013-09-08 MED ORDER — CYCLOBENZAPRINE HCL 10 MG PO TABS
10.0000 mg | ORAL_TABLET | Freq: Once | ORAL | Status: AC
  Administered 2013-09-08: 10 mg via ORAL
  Filled 2013-09-08: qty 1

## 2013-09-08 MED ORDER — CYCLOBENZAPRINE HCL 5 MG PO TABS: 5.0000 mg | ORAL_TABLET | Freq: Three times a day (TID) | ORAL | Status: DC | PRN
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
## 2013-09-08 NOTE — ED Notes (Signed)
Pt took c collar off and stated he could not wear it.

## 2013-09-08 NOTE — ED Notes (Signed)
Pt log rolled and taken off backboard per vo dr Lars MageI Knapp.
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
## 2013-09-08 NOTE — Discharge Instructions (Signed)
Ice packs to the injured or sore muscles for the next several days then start using heat. Take the medications for pain and muscle spasms. Return to the ED for any problems listed on the head injury sheet. Recheck if you aren't improving in the next week. ° °
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
## 2013-11-19 IMAGING — CR DG LUMBAR SPINE COMPLETE 4+V
5 series · 5 of 5 positions shown · non-contrast
Comparison: 10/11/2010

CLINICAL DATA: Back pain.  Left lower extremity radiculopathy.
Popping sensation in the back today.

LUMBAR SPINE - COMPLETE 4+ VIEW

[view not recorded (1 of 5)]
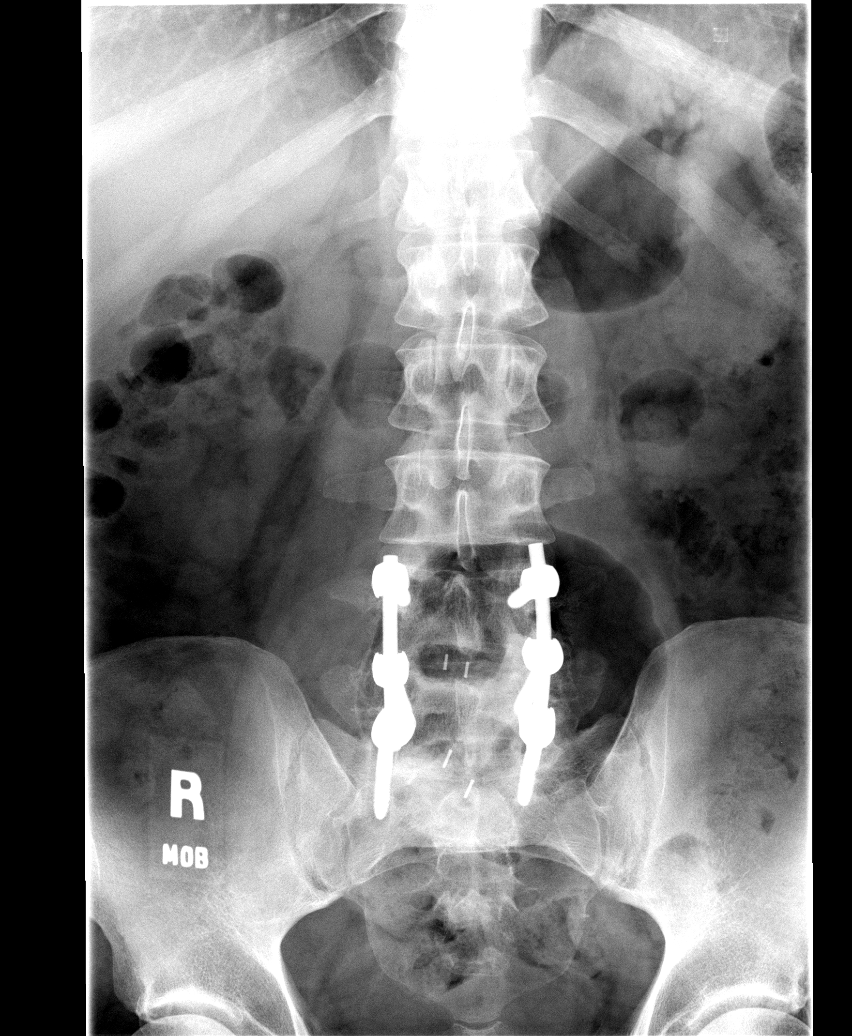

[view not recorded (2 of 5)]
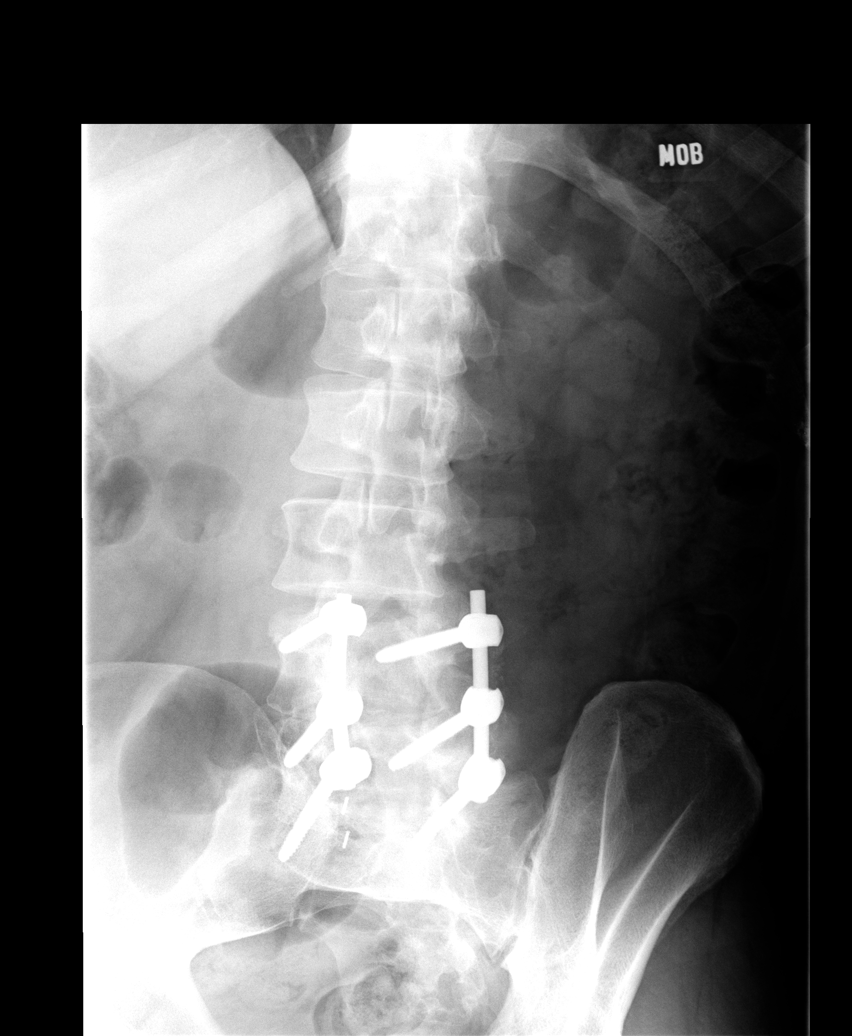

[view not recorded (3 of 5)]
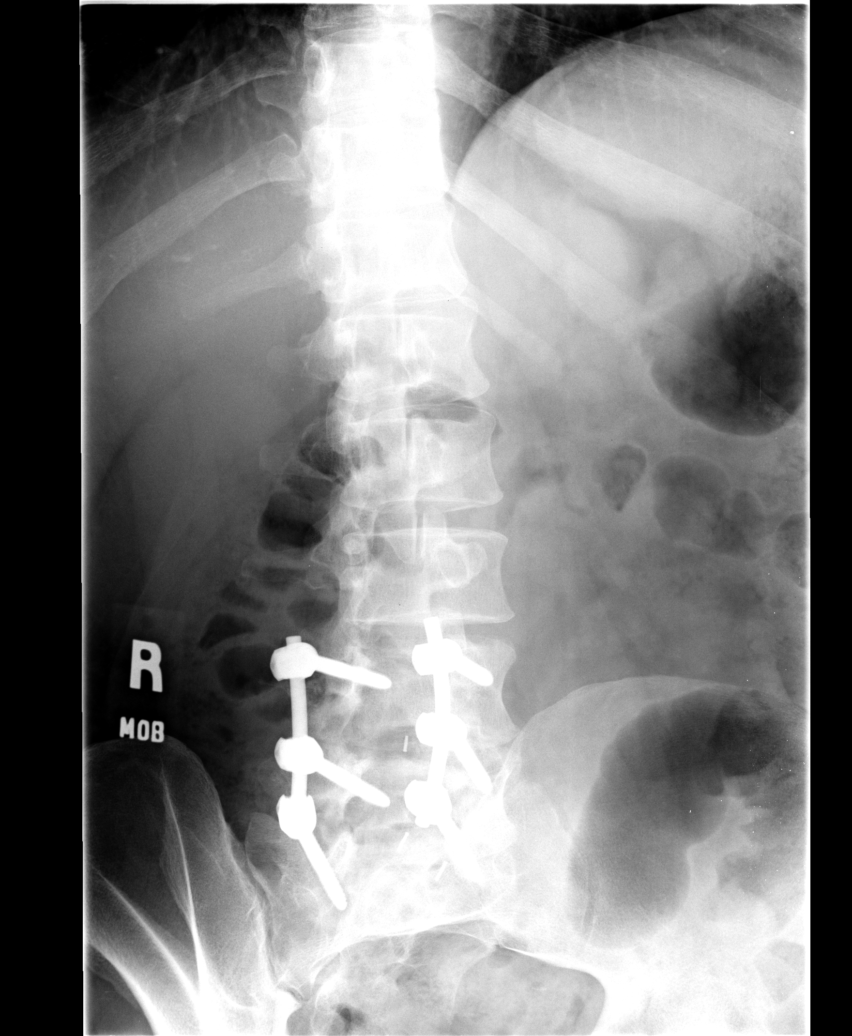

[view not recorded (4 of 5)]
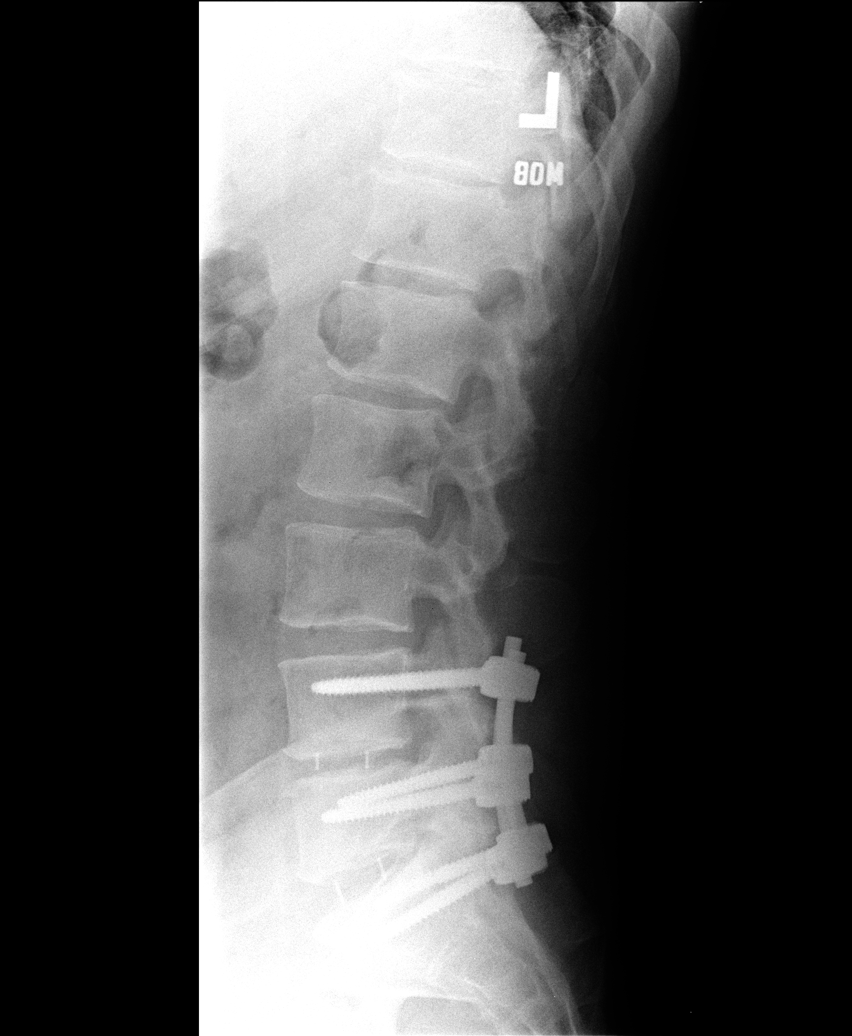

[view not recorded (5 of 5)]
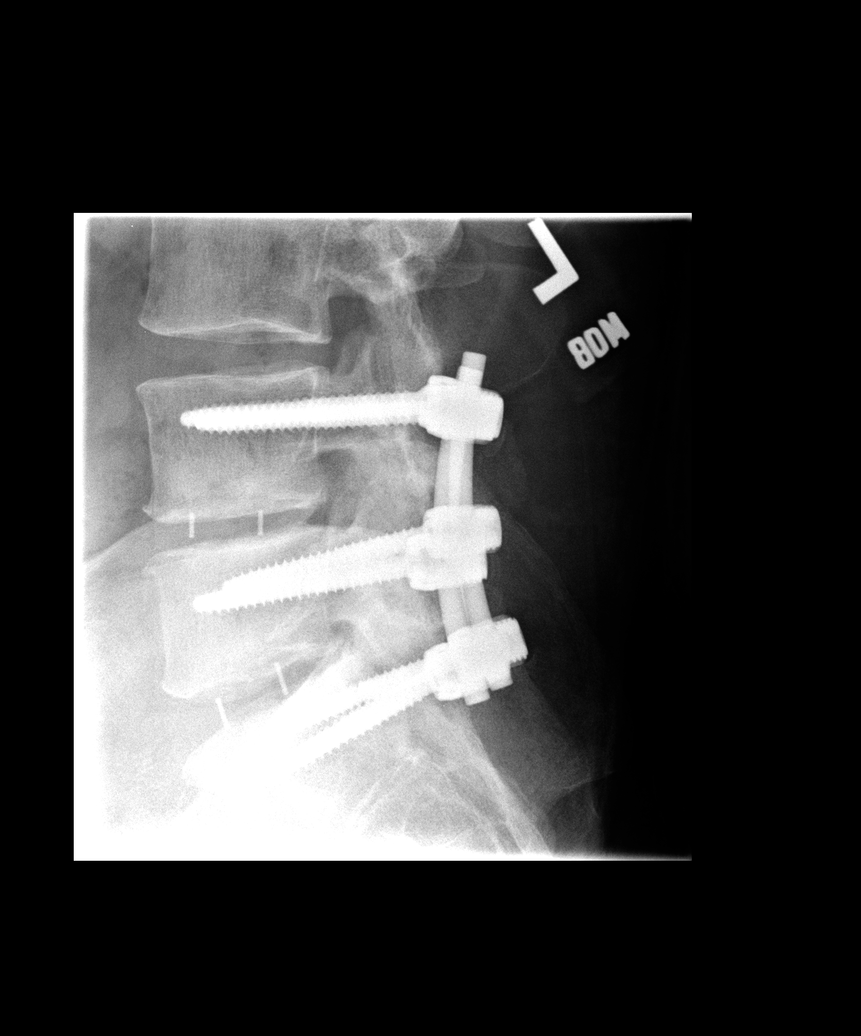

[5 of 5 positions shown; findings below may reference images not displayed]

FINDINGS: Posterolateral rod pedicle screw fixation noted at the L4-
L5-S1, without hardware complicating features identified.

No fracture or subluxation is observed.
IMPRESSION: 1.  Postoperative findings in the lower lumbar spine, without
significant change or acute bony findings.

## 2014-03-28 ENCOUNTER — Emergency Department (HOSPITAL_COMMUNITY)
Admission: EM | Admit: 2014-03-28 | Discharge: 2014-03-28 | Disposition: A | Discharge status: Home / Self Care | Payer: Medicaid Other | Attending: Emergency Medicine | Admitting: Emergency Medicine

## 2014-03-28 ENCOUNTER — Encounter (HOSPITAL_COMMUNITY): Payer: Self-pay | Admitting: Emergency Medicine

## 2014-03-28 ENCOUNTER — Emergency Department (HOSPITAL_COMMUNITY): Payer: Medicaid Other

## 2014-03-28 DIAGNOSIS — Y9389 Activity, other specified: Secondary | ICD-10-CM | POA: Insufficient documentation

## 2014-03-28 DIAGNOSIS — Z791 Long term (current) use of non-steroidal anti-inflammatories (NSAID): Secondary | ICD-10-CM | POA: Insufficient documentation

## 2014-03-28 DIAGNOSIS — Z8739 Personal history of other diseases of the musculoskeletal system and connective tissue: Secondary | ICD-10-CM | POA: Insufficient documentation

## 2014-03-28 DIAGNOSIS — S8991XA Unspecified injury of right lower leg, initial encounter: Secondary | ICD-10-CM

## 2014-03-28 DIAGNOSIS — G8929 Other chronic pain: Secondary | ICD-10-CM | POA: Insufficient documentation

## 2014-03-28 DIAGNOSIS — Z79899 Other long term (current) drug therapy: Secondary | ICD-10-CM | POA: Insufficient documentation

## 2014-03-28 DIAGNOSIS — I10 Essential (primary) hypertension: Secondary | ICD-10-CM | POA: Insufficient documentation

## 2014-03-28 DIAGNOSIS — K219 Gastro-esophageal reflux disease without esophagitis: Secondary | ICD-10-CM | POA: Diagnosis not present

## 2014-03-28 DIAGNOSIS — Y998 Other external cause status: Secondary | ICD-10-CM | POA: Diagnosis not present

## 2014-03-28 DIAGNOSIS — Z720 Tobacco use: Secondary | ICD-10-CM | POA: Insufficient documentation

## 2014-03-28 DIAGNOSIS — Y929 Unspecified place or not applicable: Secondary | ICD-10-CM | POA: Diagnosis not present

## 2014-03-28 DIAGNOSIS — X58XXXA Exposure to other specified factors, initial encounter: Secondary | ICD-10-CM | POA: Insufficient documentation

## 2014-03-28 DIAGNOSIS — X501XXA Overexertion from prolonged static or awkward postures, initial encounter: Secondary | ICD-10-CM

## 2014-03-28 MED ORDER — KETOROLAC TROMETHAMINE 60 MG/2ML IM SOLN
60.0000 mg | Freq: Once | INTRAMUSCULAR | Status: AC
  Administered 2014-03-28: 60 mg via INTRAMUSCULAR
  Filled 2014-03-28: qty 2

## 2014-03-28 MED ORDER — NAPROXEN 500 MG PO TABS: 500.0000 mg | ORAL_TABLET | Freq: Two times a day (BID) | ORAL | Status: DC

## 2014-03-28 MED ORDER — OXYCODONE-ACETAMINOPHEN 5-325 MG PO TABS
2.0000 | ORAL_TABLET | Freq: Once | ORAL | Status: AC
  Administered 2014-03-28: 2 via ORAL
  Filled 2014-03-28: qty 2

## 2014-03-28 MED ORDER — OXYCODONE-ACETAMINOPHEN 5-325 MG PO TABS: 1.0000 | ORAL_TABLET | ORAL | Status: DC | PRN

## 2014-03-28 NOTE — ED Notes (Signed)
Rt knee pain for over 2 weeks after "twisting  It",  Increasing pain since then

## 2014-03-28 NOTE — ED Notes (Signed)
Complaining of right knee pain, states he twisted it 2 weeks ago, pain and fluid getting worse, hx for knee surgery

## 2014-03-28 NOTE — ED Provider Notes (Signed)
CSN: [id number]     Arrival date & time 03/28/14  1611 History  This chart was scribed for non-physician practitioner, Pauline Ausammy Jossilyn Benda, PA-C,working with Juliet RudeNathan R. Rubin PayorPickering, MD, by Karle PlumberJennifer Tensley, ED Scribe. This patient was seen in room APFT20/APFT20 and the patient's care was started at 7:06 PM.  Chief Complaint  Patient presents with  . Knee Pain   Patient is a 38 y.o. male presenting with knee pain. The history is provided by the patient. No language interpreter was used.  Knee Pain Associated symptoms: no back pain, no fatigue, no fever and no neck pain     HPI Comments:  Sharin GraveRodney P Flott is a 38 y.o. male who presents to the Emergency Department complaining of an injury to his right knee he sustained by slipping and twisting the knee two weeks ago. He reports the pain is worsening and is now severe throbbing, stabbing and states he feels as if fluid is collecting around it. He reports h/o ACL repair 10-12 years ago by Dr. Sherlean FootLucey. Pt reports icing the area, taking Ibuprofen and Tylenol with Codeine with no significant relief of the pain. He states extension of the knee makes the pain worse. Denies alleviating factors. Denies numbness, tingling or weakness of the LLE, fever, or chills. He states this is the first time he has been seen for this issue.  Past Medical History  Diagnosis Date  . Sciatica   . Chronic back pain   . GERD (gastroesophageal reflux disease)   . Arthritis   . Hypertension    Past Surgical History  Procedure Laterality Date  . Neck surgery    . Knee surgery      bilateral ACL repair  . Anterior cervical decomp/discectomy fusion    . Shoulder arthroscopy      Left  . Back surgery      4 prior back surgeries   No family history on file. History  Substance Use Topics  . Smoking status: Current Every Day Smoker -- 0.50 packs/day for 23 years  . Smokeless tobacco: Not on file  . Alcohol Use: Yes     Comment: liqour today    Review of Systems   Constitutional: Negative for fever, chills and fatigue.  HENT: Negative for sore throat and trouble swallowing.   Respiratory: Negative for cough, shortness of breath and wheezing.   Cardiovascular: Negative for chest pain and palpitations.  Gastrointestinal: Negative for nausea, vomiting, abdominal pain and blood in stool.  Genitourinary: Negative for dysuria, hematuria and flank pain.  Musculoskeletal: Positive for joint swelling and arthralgias. Negative for myalgias, back pain, neck pain and neck stiffness.  Skin: Negative for rash.  Neurological: Negative for dizziness, weakness and numbness.  Hematological: Does not bruise/bleed easily.    Allergies  Review of patient's allergies indicates no known allergies.  Home Medications   Prior to Admission medications   Medication Sig Start Date End Date Taking? Authorizing Provider  aspirin-acetaminophen-caffeine (EXCEDRIN EXTRA STRENGTH) [phone] MG per tablet Take 2 tablets by mouth every 6 (six) hours as needed for headache.    Historical Provider, MD  cyclobenzaprine (FLEXERIL) 5 MG tablet Take 1 tablet (5 mg total) by mouth 3 (three) times daily as needed. 09/08/13   Ward GivensIva L Knapp, MD  escitalopram (LEXAPRO) 10 MG tablet Take 10 mg by mouth daily.    Historical Provider, MD  naproxen (NAPROSYN) 500 MG tablet Take 1 tablet (500 mg total) by mouth 2 (two) times daily. 09/08/13   Ward GivensIva L Knapp, MD  omeprazole (PRILOSEC) 20 MG capsule Take 20 mg by mouth daily.    Historical Provider, MD  Oxycodone HCl 10 MG TABS Take 10 mg by mouth 5 (five) times daily.    Historical Provider, MD  pregabalin (LYRICA) 200 MG capsule Take 200 mg by mouth 3 (three) times daily.     Historical Provider, MD   Triage Vitals: BP 146/81 mmHg  Pulse 83  Temp(Src) 98.2 F (36.8 C) (Oral)  Resp 18  Ht 6\' 1"  (1.854 m)  Wt 240 lb (108.863 kg)  BMI 31.67 kg/m2  SpO2 100% Physical Exam  Constitutional: He is oriented to person, place, and time. He appears  well-developed and well-nourished.  HENT:  Head: Normocephalic and atraumatic.  Eyes: EOM are normal.  Neck: Normal range of motion.  Cardiovascular: Normal rate.   Pulmonary/Chest: Effort normal.  Musculoskeletal: Normal range of motion. He exhibits edema and tenderness.  Medial tenderness to palpation of right knee. No obvious ligament instability. Mild effusion present. No erythema or excessive warmth.  Neurological: He is alert and oriented to person, place, and time.  Skin: Skin is warm and dry.  Psychiatric: He has a normal mood and affect. His behavior is normal.  Nursing note and vitals reviewed.   ED Course  Procedures (including critical care time) DIAGNOSTIC STUDIES: Oxygen Saturation is 100% on RA, normal by my interpretation.   COORDINATION OF CARE: 7:11 PM- Will X-Ray left knee. Pt verbalizes understanding and agrees to plan.  Medications - No data to display  Labs Review Labs Reviewed - No data to display  Imaging Review Dg Knee Complete 4 Views Right  03/28/2014   CLINICAL DATA:  Right lateral and posterior knee pain following losing balance on stairs.  EXAM: RIGHT KNEE - COMPLETE 4+ VIEW  COMPARISON:  04/23/2010  FINDINGS: Postsurgical changes are noted consistent with the given clinical history of ACL repair. Three compartment with degenerative changes are noted most marked medially. The overall appearance is stable from the prior exam. No acute fracture or dislocation is seen. Minimal joint effusion is noted.  IMPRESSION: Postoperative and degenerative changes without acute bony abnormality. A [place] joint effusion is noted.   Electronically Signed   By: Alcide CleverMark  Lukens M.D.   On: 03/28/2014 20:08     EKG Interpretation None      MDM   Final diagnoses:  Injury caused by twisting  Knee injury, right, initial encounter    Pt is well appearing.  Knee immob applied.  Crutches given.  No concerning sx for septic joint.  He agrees to naprosyn, Microbiologistpercocet and close  orthopedic f/u.  I have explained that a ligamentous injury may be possible.    I personally performed the services described in this documentation, which was scribed in my presence. The recorded information has been reviewed and is accurate.    Eric Nees L. Trisha Mangleriplett, PA-C 03/30/14 1808  Juliet RudeNathan R. Rubin PayorPickering, MD 03/30/14 2358

## 2019-12-01 ENCOUNTER — Emergency Department (HOSPITAL_COMMUNITY): Payer: Medicaid Other

## 2019-12-01 ENCOUNTER — Inpatient Hospital Stay (HOSPITAL_COMMUNITY): Payer: Medicaid Other | Admitting: Anesthesiology

## 2019-12-01 ENCOUNTER — Inpatient Hospital Stay (HOSPITAL_COMMUNITY)
Admission: EM | Admit: 2019-12-01 | Discharge: 2019-12-06 | DRG: 167 | Discharge status: Against Medical Advice | Payer: Medicaid Other | Attending: General Surgery | Admitting: General Surgery

## 2019-12-01 ENCOUNTER — Inpatient Hospital Stay (HOSPITAL_COMMUNITY): Payer: Medicaid Other

## 2019-12-01 ENCOUNTER — Encounter (HOSPITAL_COMMUNITY): Payer: Self-pay | Admitting: General Surgery

## 2019-12-01 ENCOUNTER — Encounter (HOSPITAL_COMMUNITY): Admission: EM | Discharge status: Against Medical Advice | Payer: Self-pay | Source: Home / Self Care

## 2019-12-01 DIAGNOSIS — S0232XA Fracture of orbital floor, left side, initial encounter for closed fracture: Secondary | ICD-10-CM | POA: Diagnosis present

## 2019-12-01 DIAGNOSIS — S0240CA Maxillary fracture, right side, initial encounter for closed fracture: Secondary | ICD-10-CM | POA: Diagnosis present

## 2019-12-01 DIAGNOSIS — Z23 Encounter for immunization: Secondary | ICD-10-CM | POA: Diagnosis not present

## 2019-12-01 DIAGNOSIS — F1110 Opioid abuse, uncomplicated: Secondary | ICD-10-CM | POA: Diagnosis present

## 2019-12-01 DIAGNOSIS — S27322A Contusion of lung, bilateral, initial encounter: Secondary | ICD-10-CM | POA: Diagnosis present

## 2019-12-01 DIAGNOSIS — S0292XA Unspecified fracture of facial bones, initial encounter for closed fracture: Secondary | ICD-10-CM

## 2019-12-01 DIAGNOSIS — Z9689 Presence of other specified functional implants: Secondary | ICD-10-CM

## 2019-12-01 DIAGNOSIS — I10 Essential (primary) hypertension: Secondary | ICD-10-CM | POA: Diagnosis present

## 2019-12-01 DIAGNOSIS — S0240DA Maxillary fracture, left side, initial encounter for closed fracture: Secondary | ICD-10-CM | POA: Diagnosis present

## 2019-12-01 DIAGNOSIS — Z20822 Contact with and (suspected) exposure to covid-19: Secondary | ICD-10-CM | POA: Diagnosis present

## 2019-12-01 DIAGNOSIS — S82841C Displaced bimalleolar fracture of right lower leg, initial encounter for open fracture type IIIA, IIIB, or IIIC: Secondary | ICD-10-CM | POA: Diagnosis not present

## 2019-12-01 DIAGNOSIS — E669 Obesity, unspecified: Secondary | ICD-10-CM | POA: Diagnosis present

## 2019-12-01 DIAGNOSIS — Z5329 Procedure and treatment not carried out because of patient's decision for other reasons: Secondary | ICD-10-CM | POA: Diagnosis present

## 2019-12-01 DIAGNOSIS — S270XXA Traumatic pneumothorax, initial encounter: Secondary | ICD-10-CM | POA: Diagnosis present

## 2019-12-01 DIAGNOSIS — S82841B Displaced bimalleolar fracture of right lower leg, initial encounter for open fracture type I or II: Secondary | ICD-10-CM | POA: Diagnosis present

## 2019-12-01 DIAGNOSIS — S92009B Unspecified fracture of unspecified calcaneus, initial encounter for open fracture: Secondary | ICD-10-CM | POA: Diagnosis present

## 2019-12-01 DIAGNOSIS — T148XXA Other injury of unspecified body region, initial encounter: Secondary | ICD-10-CM

## 2019-12-01 DIAGNOSIS — Z938 Other artificial opening status: Secondary | ICD-10-CM

## 2019-12-01 DIAGNOSIS — S02842A Fracture of lateral orbital wall, left side, initial encounter for closed fracture: Secondary | ICD-10-CM | POA: Diagnosis present

## 2019-12-01 DIAGNOSIS — S02832A Fracture of medial orbital wall, left side, initial encounter for closed fracture: Secondary | ICD-10-CM | POA: Diagnosis present

## 2019-12-01 DIAGNOSIS — S81812A Laceration without foreign body, left lower leg, initial encounter: Secondary | ICD-10-CM | POA: Diagnosis not present

## 2019-12-01 DIAGNOSIS — S92061B Displaced intraarticular fracture of right calcaneus, initial encounter for open fracture: Secondary | ICD-10-CM | POA: Diagnosis present

## 2019-12-01 DIAGNOSIS — J939 Pneumothorax, unspecified: Secondary | ICD-10-CM

## 2019-12-01 DIAGNOSIS — Y92410 Unspecified street and highway as the place of occurrence of the external cause: Secondary | ICD-10-CM

## 2019-12-01 DIAGNOSIS — Z6833 Body mass index (BMI) 33.0-33.9, adult: Secondary | ICD-10-CM | POA: Diagnosis not present

## 2019-12-01 DIAGNOSIS — S2242XA Multiple fractures of ribs, left side, initial encounter for closed fracture: Secondary | ICD-10-CM | POA: Diagnosis present

## 2019-12-01 DIAGNOSIS — E875 Hyperkalemia: Secondary | ICD-10-CM | POA: Diagnosis present

## 2019-12-01 DIAGNOSIS — Z419 Encounter for procedure for purposes other than remedying health state, unspecified: Secondary | ICD-10-CM

## 2019-12-01 DIAGNOSIS — Z4682 Encounter for fitting and adjustment of non-vascular catheter: Secondary | ICD-10-CM

## 2019-12-01 HISTORY — PX: EXTERNAL FIXATION LEG: SHX1549

## 2019-12-01 HISTORY — PX: I & D EXTREMITY: SHX5045

## 2019-12-01 LAB — COMPREHENSIVE METABOLIC PANEL
ALT: 271 U/L — ABNORMAL HIGH (ref 0–44)
AST: 261 U/L — ABNORMAL HIGH (ref 15–41)
Albumin: 3.1 g/dL — ABNORMAL LOW (ref 3.5–5.0)
Alkaline Phosphatase: 119 U/L (ref 38–126)
Anion gap: 10 (ref 5–15)
BUN: 20 mg/dL (ref 6–20)
CO2: 25 mmol/L (ref 22–32)
Calcium: 8.9 mg/dL (ref 8.9–10.3)
Chloride: 100 mmol/L (ref 98–111)
Creatinine, Ser: 1.1 mg/dL (ref 0.61–1.24)
GFR calc Af Amer: >60 mL/min (ref >60)
GFR calc non Af Amer: >60 mL/min (ref >60)
Glucose, Bld: 134 mg/dL — ABNORMAL HIGH (ref 70–99)
Potassium: 4.4 mmol/L (ref 3.5–5.1)
Sodium: 135 mmol/L (ref 135–145)
Total Bilirubin: 0.7 mg/dL (ref 0.3–1.2)
Total Protein: 8.4 g/dL — ABNORMAL HIGH (ref 6.5–8.1)

## 2019-12-01 LAB — I-STAT CHEM 8, ED
BUN: 27 mg/dL — ABNORMAL HIGH (ref 6–20)
Calcium, Ion: 0.96 mmol/L — ABNORMAL LOW (ref 1.15–1.40)
Chloride: 101 mmol/L (ref 98–111)
Creatinine, Ser: 1.1 mg/dL (ref 0.61–1.24)
Glucose, Bld: 129 mg/dL — ABNORMAL HIGH (ref 70–99)
HCT: 42 % (ref 39.0–52.0)
Hemoglobin: 14.3 g/dL (ref 13.0–17.0)
Potassium: 4.4 mmol/L (ref 3.5–5.1)
Sodium: 139 mmol/L (ref 135–145)
TCO2: 26 mmol/L (ref 22–32)

## 2019-12-01 LAB — PROTIME-INR
INR: 1.1 (ref 0.8–1.2)
Prothrombin Time: 13.7 seconds (ref 11.4–15.2)

## 2019-12-01 LAB — SARS CORONAVIRUS 2 BY RT PCR (HOSPITAL ORDER, PERFORMED IN ~~LOC~~ HOSPITAL LAB): SARS Coronavirus 2: NEGATIVE

## 2019-12-01 LAB — CBC
HCT: 41.9 % (ref 39.0–52.0)
Hemoglobin: 13.5 g/dL (ref 13.0–17.0)
MCH: 31 pg (ref 26.0–34.0)
MCHC: 32.2 g/dL (ref 30.0–36.0)
MCV: 96.3 fL (ref 80.0–100.0)
Platelets: 182 10*3/uL (ref 150–400)
RBC: 4.35 MIL/uL (ref 4.22–5.81)
RDW: 14.6 % (ref 11.5–15.5)
WBC: 12.6 10*3/uL — ABNORMAL HIGH (ref 4.0–10.5)
nRBC: 0 % (ref 0.0–0.2)

## 2019-12-01 LAB — ETHANOL: Alcohol, Ethyl (B): <10 mg/dL (ref <10)

## 2019-12-01 LAB — LACTIC ACID, PLASMA: Lactic Acid, Venous: 3.1 mmol/L (ref 0.5–1.9)

## 2019-12-01 LAB — HIV ANTIBODY (ROUTINE TESTING W REFLEX): HIV Screen 4th Generation wRfx: NONREACTIVE

## 2019-12-01 SURGERY — IRRIGATION AND DEBRIDEMENT EXTREMITY
Anesthesia: General | Laterality: Right

## 2019-12-01 MED ORDER — CEFAZOLIN SODIUM-DEXTROSE 2-4 GM/100ML-% IV SOLN
2.0000 g | Freq: Three times a day (TID) | INTRAVENOUS | Status: AC
  Administered 2019-12-01 – 2019-12-02 (×3): 2 g via INTRAVENOUS
  Filled 2019-12-01 (×3): qty 100

## 2019-12-01 MED ORDER — SODIUM CHLORIDE 0.9 % IR SOLN
Status: DC | PRN
  Administered 2019-12-01: 9000 mL

## 2019-12-01 MED ORDER — ACETAMINOPHEN 500 MG PO TABS
1000.0000 mg | ORAL_TABLET | Freq: Four times a day (QID) | ORAL | Status: AC
  Administered 2019-12-01 – 2019-12-02 (×3): 1000 mg via ORAL
  Filled 2019-12-01 (×4): qty 2

## 2019-12-01 MED ORDER — BISACODYL 10 MG RE SUPP: 10.0000 mg | Freq: Every day | RECTAL | Status: DC | PRN

## 2019-12-01 MED ORDER — VANCOMYCIN HCL 1000 MG IV SOLR
INTRAVENOUS | Status: AC
  Filled 2019-12-01: qty 1000

## 2019-12-01 MED ORDER — ONDANSETRON HCL 4 MG/2ML IJ SOLN: 4.0000 mg | Freq: Four times a day (QID) | INTRAMUSCULAR | Status: DC | PRN

## 2019-12-01 MED ORDER — OXYCODONE HCL 5 MG PO TABS
5.0000 mg | ORAL_TABLET | ORAL | Status: DC | PRN
  Filled 2019-12-01: qty 2

## 2019-12-01 MED ORDER — VANCOMYCIN HCL 1000 MG IV SOLR
INTRAVENOUS | Status: DC | PRN
  Administered 2019-12-01: 1000 mg

## 2019-12-01 MED ORDER — DOCUSATE SODIUM 100 MG PO CAPS: 100.0000 mg | ORAL_CAPSULE | Freq: Two times a day (BID) | ORAL | Status: DC

## 2019-12-01 MED ORDER — LIDOCAINE 2% (20 MG/ML) 5 ML SYRINGE
INTRAMUSCULAR | Status: DC | PRN
  Administered 2019-12-01: 100 mg via INTRAVENOUS

## 2019-12-01 MED ORDER — PHENYLEPHRINE HCL-NACL 10-0.9 MG/250ML-% IV SOLN
INTRAVENOUS | Status: DC | PRN
  Administered 2019-12-01: 20 ug/min via INTRAVENOUS

## 2019-12-01 MED ORDER — HYDROMORPHONE HCL 1 MG/ML IJ SOLN: 0.5000 mg | INTRAMUSCULAR | Status: DC | PRN

## 2019-12-01 MED ORDER — CHLORHEXIDINE GLUCONATE 4 % EX LIQD
60.0000 mL | Freq: Once | CUTANEOUS | Status: DC
  Filled 2019-12-01: qty 60

## 2019-12-01 MED ORDER — DEXAMETHASONE SODIUM PHOSPHATE 10 MG/ML IJ SOLN
INTRAMUSCULAR | Status: AC
  Filled 2019-12-01: qty 1

## 2019-12-01 MED ORDER — DEXAMETHASONE SODIUM PHOSPHATE 10 MG/ML IJ SOLN
INTRAMUSCULAR | Status: DC | PRN
  Administered 2019-12-01: 4 mg via INTRAVENOUS

## 2019-12-01 MED ORDER — ONDANSETRON HCL 4 MG PO TABS: 4.0000 mg | ORAL_TABLET | Freq: Four times a day (QID) | ORAL | Status: DC | PRN

## 2019-12-01 MED ORDER — CHLORHEXIDINE GLUCONATE 4 % EX LIQD: 60.0000 mL | Freq: Once | CUTANEOUS | Status: DC

## 2019-12-01 MED ORDER — LIDOCAINE 2% (20 MG/ML) 5 ML SYRINGE
INTRAMUSCULAR | Status: AC
  Filled 2019-12-01: qty 5

## 2019-12-01 MED ORDER — ARTIFICIAL TEARS OPHTHALMIC OINT
TOPICAL_OINTMENT | OPHTHALMIC | Status: AC
  Filled 2019-12-01: qty 3.5

## 2019-12-01 MED ORDER — HYDROMORPHONE HCL 1 MG/ML IJ SOLN
INTRAMUSCULAR | Status: AC
  Filled 2019-12-01: qty 1

## 2019-12-01 MED ORDER — LACTATED RINGERS IV SOLN: INTRAVENOUS | Status: DC

## 2019-12-01 MED ORDER — HYDROMORPHONE HCL 1 MG/ML IJ SOLN
2.0000 mg | INTRAMUSCULAR | Status: DC | PRN
  Administered 2019-12-01: 2 mg via INTRAVENOUS
  Filled 2019-12-01 (×2): qty 2

## 2019-12-01 MED ORDER — ENOXAPARIN SODIUM 30 MG/0.3ML ~~LOC~~ SOLN
30.0000 mg | Freq: Two times a day (BID) | SUBCUTANEOUS | Status: DC
  Administered 2019-12-02 – 2019-12-05 (×6): 30 mg via SUBCUTANEOUS
  Filled 2019-12-01 (×8): qty 0.3

## 2019-12-01 MED ORDER — METOCLOPRAMIDE HCL 5 MG/ML IJ SOLN: 5.0000 mg | Freq: Three times a day (TID) | INTRAMUSCULAR | Status: DC | PRN

## 2019-12-01 MED ORDER — CEFAZOLIN SODIUM-DEXTROSE 2-4 GM/100ML-% IV SOLN: 2.0000 g | INTRAVENOUS | Status: DC

## 2019-12-01 MED ORDER — PROPOFOL 10 MG/ML IV BOLUS
INTRAVENOUS | Status: AC
  Filled 2019-12-01: qty 20

## 2019-12-01 MED ORDER — VANCOMYCIN HCL 500 MG IV SOLR
INTRAVENOUS | Status: AC
  Filled 2019-12-01: qty 500

## 2019-12-01 MED ORDER — TETANUS-DIPHTH-ACELL PERTUSSIS 5-2.5-18.5 LF-MCG/0.5 IM SUSP: 0.5000 mL | Freq: Once | INTRAMUSCULAR | Status: DC

## 2019-12-01 MED ORDER — SUGAMMADEX SODIUM 200 MG/2ML IV SOLN: INTRAVENOUS | Status: DC | PRN

## 2019-12-01 MED ORDER — SUGAMMADEX SODIUM 200 MG/2ML IV SOLN
INTRAVENOUS | Status: DC | PRN
  Administered 2019-12-01: 200 mg via INTRAVENOUS

## 2019-12-01 MED ORDER — POVIDONE-IODINE 10 % EX SWAB: 2.0000 "application " | Freq: Once | CUTANEOUS | Status: DC

## 2019-12-01 MED ORDER — OXYCODONE HCL 5 MG PO TABS
5.0000 mg | ORAL_TABLET | ORAL | Status: DC | PRN
  Administered 2019-12-01 – 2019-12-02 (×2): 10 mg via ORAL
  Administered 2019-12-02: 5 mg via ORAL
  Administered 2019-12-02 – 2019-12-03 (×2): 10 mg via ORAL
  Administered 2019-12-03: 5 mg via ORAL
  Administered 2019-12-04 – 2019-12-05 (×9): 10 mg via ORAL
  Administered 2019-12-05: 5 mg via ORAL
  Administered 2019-12-05 – 2019-12-06 (×2): 10 mg via ORAL
  Filled 2019-12-01 (×12): qty 2
  Filled 2019-12-01: qty 1
  Filled 2019-12-01: qty 2
  Filled 2019-12-01: qty 1
  Filled 2019-12-01 (×4): qty 2

## 2019-12-01 MED ORDER — IOHEXOL 300 MG/ML  SOLN
100.0000 mL | Freq: Once | INTRAMUSCULAR | Status: AC | PRN
  Administered 2019-12-01: 100 mL via INTRAVENOUS

## 2019-12-01 MED ORDER — PANTOPRAZOLE SODIUM 40 MG IV SOLR
40.0000 mg | Freq: Every day | INTRAVENOUS | Status: DC
  Administered 2019-12-01: 40 mg via INTRAVENOUS
  Filled 2019-12-01: qty 40

## 2019-12-01 MED ORDER — ONDANSETRON HCL 4 MG/2ML IJ SOLN
INTRAMUSCULAR | Status: DC | PRN
  Administered 2019-12-01: 4 mg via INTRAVENOUS

## 2019-12-01 MED ORDER — FENTANYL CITRATE (PF) 250 MCG/5ML IJ SOLN
INTRAMUSCULAR | Status: AC
  Filled 2019-12-01: qty 5

## 2019-12-01 MED ORDER — ONDANSETRON 4 MG PO TBDP: 4.0000 mg | ORAL_TABLET | Freq: Four times a day (QID) | ORAL | Status: DC | PRN

## 2019-12-01 MED ORDER — ONDANSETRON HCL 4 MG/2ML IJ SOLN
INTRAMUSCULAR | Status: AC
  Filled 2019-12-01: qty 2

## 2019-12-01 MED ORDER — PANTOPRAZOLE SODIUM 40 MG PO TBEC
40.0000 mg | DELAYED_RELEASE_TABLET | Freq: Every day | ORAL | Status: DC
  Administered 2019-12-02 – 2019-12-05 (×3): 40 mg via ORAL
  Filled 2019-12-01 (×5): qty 1

## 2019-12-01 MED ORDER — CEFAZOLIN SODIUM-DEXTROSE 2-4 GM/100ML-% IV SOLN
2.0000 g | INTRAVENOUS | Status: AC
  Administered 2019-12-01: 2 g via INTRAVENOUS

## 2019-12-01 MED ORDER — DOCUSATE SODIUM 100 MG PO CAPS
100.0000 mg | ORAL_CAPSULE | Freq: Two times a day (BID) | ORAL | Status: DC
  Administered 2019-12-01 – 2019-12-05 (×8): 100 mg via ORAL
  Filled 2019-12-01 (×12): qty 1

## 2019-12-01 MED ORDER — ROCURONIUM BROMIDE 10 MG/ML (PF) SYRINGE
PREFILLED_SYRINGE | INTRAVENOUS | Status: DC | PRN
  Administered 2019-12-01: 50 mg via INTRAVENOUS

## 2019-12-01 MED ORDER — METOPROLOL TARTRATE 5 MG/5ML IV SOLN: 5.0000 mg | Freq: Four times a day (QID) | INTRAVENOUS | Status: DC | PRN

## 2019-12-01 MED ORDER — MUPIROCIN 2 % EX OINT
TOPICAL_OINTMENT | CUTANEOUS | Status: AC
  Filled 2019-12-01: qty 22

## 2019-12-01 MED ORDER — HYDROMORPHONE HCL 1 MG/ML IJ SOLN: 1.0000 mg | INTRAMUSCULAR | Status: DC | PRN

## 2019-12-01 MED ORDER — ACETAMINOPHEN 10 MG/ML IV SOLN
INTRAVENOUS | Status: DC | PRN
Start: 2019-12-01 — End: 2019-12-01
  Administered 2019-12-01: 1000 mg via INTRAVENOUS

## 2019-12-01 MED ORDER — PROPOFOL 10 MG/ML IV BOLUS
INTRAVENOUS | Status: DC | PRN
  Administered 2019-12-01: 100 mg via INTRAVENOUS

## 2019-12-01 MED ORDER — CELECOXIB 200 MG PO CAPS: 200.0000 mg | ORAL_CAPSULE | Freq: Once | ORAL | Status: DC

## 2019-12-01 MED ORDER — ACETAMINOPHEN 500 MG PO TABS: 1000.0000 mg | ORAL_TABLET | Freq: Once | ORAL | Status: DC

## 2019-12-01 MED ORDER — METOCLOPRAMIDE HCL 10 MG PO TABS: 5.0000 mg | ORAL_TABLET | Freq: Three times a day (TID) | ORAL | Status: DC | PRN

## 2019-12-01 MED ORDER — ALBUMIN HUMAN 5 % IV SOLN
INTRAVENOUS | Status: DC | PRN
Start: 2019-12-01 — End: 2019-12-01

## 2019-12-01 MED ORDER — PROMETHAZINE HCL 25 MG/ML IJ SOLN: 6.2500 mg | INTRAMUSCULAR | Status: DC | PRN

## 2019-12-01 MED ORDER — ACETAMINOPHEN 10 MG/ML IV SOLN
INTRAVENOUS | Status: AC
  Filled 2019-12-01: qty 100

## 2019-12-01 MED ORDER — POTASSIUM CHLORIDE IN NACL 20-0.9 MEQ/L-% IV SOLN
INTRAVENOUS | Status: DC
  Filled 2019-12-01 (×2): qty 1000

## 2019-12-01 MED ORDER — SUCCINYLCHOLINE CHLORIDE 200 MG/10ML IV SOSY
PREFILLED_SYRINGE | INTRAVENOUS | Status: DC | PRN
  Administered 2019-12-01: 160 mg via INTRAVENOUS

## 2019-12-01 MED ORDER — CEFAZOLIN SODIUM-DEXTROSE 2-4 GM/100ML-% IV SOLN
2.0000 g | INTRAVENOUS | Status: DC
  Filled 2019-12-01: qty 100

## 2019-12-01 MED ORDER — MUPIROCIN CALCIUM 2 % EX CREA
TOPICAL_CREAM | CUTANEOUS | Status: DC | PRN
  Administered 2019-12-01: 1 via TOPICAL

## 2019-12-01 MED ORDER — HYDROMORPHONE HCL 1 MG/ML IJ SOLN
0.2500 mg | INTRAMUSCULAR | Status: DC | PRN
  Administered 2019-12-01 (×4): 0.5 mg via INTRAVENOUS

## 2019-12-01 MED ORDER — MIDAZOLAM HCL 2 MG/2ML IJ SOLN
INTRAMUSCULAR | Status: AC
  Filled 2019-12-01: qty 2

## 2019-12-01 MED ORDER — MUPIROCIN CALCIUM 2 % EX CREA
TOPICAL_CREAM | CUTANEOUS | Status: AC
  Filled 2019-12-01: qty 15

## 2019-12-01 MED ORDER — FENTANYL CITRATE (PF) 100 MCG/2ML IJ SOLN
INTRAMUSCULAR | Status: DC | PRN
  Administered 2019-12-01 (×2): 50 ug via INTRAVENOUS

## 2019-12-01 MED ORDER — LACTATED RINGERS IV SOLN
INTRAVENOUS | Status: DC | PRN
Start: 2019-12-01 — End: 2019-12-01

## 2019-12-01 SURGICAL SUPPLY — 92 items
ALCOHOL 70% 16 OZ (MISCELLANEOUS) ×3 IMPLANT
BANDAGE ESMARK 6X9 LF (GAUZE/BANDAGES/DRESSINGS) ×2 IMPLANT
BAR GLASS FIBER EXFX 11X100 (EXFIX) ×6 IMPLANT
BAR GLASS FIBER EXFX 11X350 (EXFIX) ×6 IMPLANT
BNDG COHESIVE 4X5 TAN STRL (GAUZE/BANDAGES/DRESSINGS) IMPLANT
BNDG COHESIVE 6X5 TAN STRL LF (GAUZE/BANDAGES/DRESSINGS) ×3 IMPLANT
BNDG ELASTIC 3X5.8 VLCR STR LF (GAUZE/BANDAGES/DRESSINGS) ×3 IMPLANT
BNDG ELASTIC 4X5.8 VLCR STR LF (GAUZE/BANDAGES/DRESSINGS) ×6 IMPLANT
BNDG ELASTIC 6X10 VLCR STRL LF (GAUZE/BANDAGES/DRESSINGS) ×3 IMPLANT
BNDG ELASTIC 6X5.8 VLCR STR LF (GAUZE/BANDAGES/DRESSINGS) IMPLANT
BNDG ESMARK 6X9 LF (GAUZE/BANDAGES/DRESSINGS) ×3
BNDG GAUZE ELAST 4 BULKY (GAUZE/BANDAGES/DRESSINGS) IMPLANT
CLAMP BLUE BAR TO BAR (EXFIX) ×6 IMPLANT
CLAMP BLUE BAR TO PIN (EXFIX) ×12 IMPLANT
CLEANER TIP ELECTROSURG 2X2 (MISCELLANEOUS) ×3 IMPLANT
COVER SURGICAL LIGHT HANDLE (MISCELLANEOUS) ×3 IMPLANT
COVER WAND RF STERILE (DRAPES) ×3 IMPLANT
CUFF TOURN SGL QUICK 18X4 (TOURNIQUET CUFF) IMPLANT
CUFF TOURN SGL QUICK 24 (TOURNIQUET CUFF)
CUFF TOURN SGL QUICK 34 (TOURNIQUET CUFF)
CUFF TOURN SGL QUICK 42 (TOURNIQUET CUFF) IMPLANT
CUFF TRNQT CYL 24X4X16.5-23 (TOURNIQUET CUFF) IMPLANT
CUFF TRNQT CYL 34X4.125X (TOURNIQUET CUFF) IMPLANT
DRAPE C-ARM 42X72 X-RAY (DRAPES) ×3 IMPLANT
DRAPE C-ARMOR (DRAPES) ×3 IMPLANT
DRAPE OEC MINIVIEW 54X84 (DRAPES) ×3 IMPLANT
DRAPE U-SHAPE 47X51 STRL (DRAPES) ×9 IMPLANT
DRSG ADAPTIC 3X8 NADH LF (GAUZE/BANDAGES/DRESSINGS) ×3 IMPLANT
DRSG PAD ABDOMINAL 8X10 ST (GAUZE/BANDAGES/DRESSINGS) IMPLANT
DRSG XEROFORM 1X8 (GAUZE/BANDAGES/DRESSINGS) ×3 IMPLANT
DURAPREP 26ML APPLICATOR (WOUND CARE) IMPLANT
ELECT REM PT RETURN 9FT ADLT (ELECTROSURGICAL) ×3
ELECTRODE REM PT RTRN 9FT ADLT (ELECTROSURGICAL) ×2 IMPLANT
EVACUATOR 1/8 PVC DRAIN (DRAIN) IMPLANT
GAUZE SPONGE 4X4 12PLY STRL (GAUZE/BANDAGES/DRESSINGS) ×6 IMPLANT
GAUZE SPONGE 4X4 12PLY STRL LF (GAUZE/BANDAGES/DRESSINGS) ×6 IMPLANT
GAUZE XEROFORM 5X9 LF (GAUZE/BANDAGES/DRESSINGS) IMPLANT
GLOVE BIOGEL PI IND STRL 7.0 (GLOVE) ×2 IMPLANT
GLOVE BIOGEL PI IND STRL 8 (GLOVE) ×2 IMPLANT
GLOVE BIOGEL PI INDICATOR 7.0 (GLOVE) ×1
GLOVE BIOGEL PI INDICATOR 8 (GLOVE) ×1
GLOVE ECLIPSE 7.0 STRL STRAW (GLOVE) ×3 IMPLANT
GLOVE ECLIPSE 8.0 STRL XLNG CF (GLOVE) ×3 IMPLANT
GOWN STRL REUS W/ TWL LRG LVL3 (GOWN DISPOSABLE) ×6 IMPLANT
GOWN STRL REUS W/ TWL XL LVL3 (GOWN DISPOSABLE) ×4 IMPLANT
GOWN STRL REUS W/TWL LRG LVL3 (GOWN DISPOSABLE) ×9
GOWN STRL REUS W/TWL XL LVL3 (GOWN DISPOSABLE) ×6
HALF PIN 4MM (EXFIX) ×6 IMPLANT
HALF PIN 5.0X160 (EXFIX) ×6 IMPLANT
HANDPIECE INTERPULSE COAX TIP (DISPOSABLE)
KIT BASIN OR (CUSTOM PROCEDURE TRAY) ×3 IMPLANT
KIT TURNOVER KIT B (KITS) ×3 IMPLANT
MANIFOLD NEPTUNE II (INSTRUMENTS) ×3 IMPLANT
NEEDLE 22X1 1/2 (OR ONLY) (NEEDLE) IMPLANT
NS IRRIG 1000ML POUR BTL (IV SOLUTION) ×27 IMPLANT
PACK ORTHO EXTREMITY (CUSTOM PROCEDURE TRAY) ×3 IMPLANT
PAD ABD 8X10 STRL (GAUZE/BANDAGES/DRESSINGS) ×3 IMPLANT
PAD ARMBOARD 7.5X6 YLW CONV (MISCELLANEOUS) ×6 IMPLANT
PAD CAST 4YDX4 CTTN HI CHSV (CAST SUPPLIES) IMPLANT
PADDING CAST ABS 6INX4YD NS (CAST SUPPLIES) ×1
PADDING CAST ABS COTTON 6X4 NS (CAST SUPPLIES) ×2 IMPLANT
PADDING CAST COTTON 4X4 STRL (CAST SUPPLIES)
PADDING CAST COTTON 6X4 STRL (CAST SUPPLIES) ×9 IMPLANT
PIN 4X100X20MM (EXFIX) ×6 IMPLANT
PIN CLAMP 2BAR 75MM BLUE (EXFIX) ×3 IMPLANT
SET HNDPC FAN SPRY TIP SCT (DISPOSABLE) IMPLANT
SET IRRIG Y TYPE TUR BLADDER L (SET/KITS/TRAYS/PACK) ×3 IMPLANT
SPONGE LAP 18X18 RF (DISPOSABLE) ×6 IMPLANT
SPONGE LAP 4X18 RFD (DISPOSABLE) IMPLANT
STAPLER VISISTAT 35W (STAPLE) IMPLANT
STOCKINETTE IMPERVIOUS 9X36 MD (GAUZE/BANDAGES/DRESSINGS) ×3 IMPLANT
STOCKINETTE IMPERVIOUS LG (DRAPES) ×3 IMPLANT
STRIP CLOSURE SKIN 1/2X4 (GAUZE/BANDAGES/DRESSINGS) IMPLANT
SUCTION FRAZIER HANDLE 10FR (MISCELLANEOUS) ×3
SUCTION TUBE FRAZIER 10FR DISP (MISCELLANEOUS) ×2 IMPLANT
SUT ETHILON 2 0 FS 18 (SUTURE) ×6 IMPLANT
SUT ETHILON 3 0 PS 1 (SUTURE) IMPLANT
SUT VIC AB 0 CT1 27 (SUTURE)
SUT VIC AB 0 CT1 27XBRD ANBCTR (SUTURE) IMPLANT
SUT VIC AB 2-0 CT1 27 (SUTURE)
SUT VIC AB 2-0 CT1 TAPERPNT 27 (SUTURE) IMPLANT
SUT VIC AB 3-0 SH 27 (SUTURE)
SUT VIC AB 3-0 SH 27X BRD (SUTURE) IMPLANT
SWAB CULTURE ESWAB REG 1ML (MISCELLANEOUS) IMPLANT
SWAB CULTURE LIQ STUART DBL (MISCELLANEOUS) IMPLANT
SYR CONTROL 10ML LL (SYRINGE) IMPLANT
TOWEL GREEN STERILE (TOWEL DISPOSABLE) ×3 IMPLANT
TOWEL GREEN STERILE FF (TOWEL DISPOSABLE) ×6 IMPLANT
TUBE CONNECTING 12X1/4 (SUCTIONS) ×3 IMPLANT
UNDERPAD 30X36 HEAVY ABSORB (UNDERPADS AND DIAPERS) ×3 IMPLANT
WATER STERILE IRR 1000ML POUR (IV SOLUTION) ×3 IMPLANT
YANKAUER SUCT BULB TIP NO VENT (SUCTIONS) ×6 IMPLANT

## 2019-12-01 NOTE — ED Notes (Signed)
Wound to right ankle/footdressed and splinted by Nolon Bussing. PA, wound to left calf also dressed by Nolon Bussing. PA
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
## 2019-12-01 NOTE — Progress Notes (Signed)
RN assessed a level 2 air leak in the patient's pleur-evac when he coughed. Patient in no acute distress, vitals stable including O2 saturations and respirations. MD notified, no new orders.

## 2019-12-01 NOTE — ED Notes (Signed)
Taken to CT with this RN and Ethel Rana RN, Violeta Gelinas MD. Elmer Sow. PA
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
## 2019-12-01 NOTE — Consult Note (Signed)
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 motion degraded. Limited non motion degraded repeat imaging was performed but does not include all of the areas which were motion degraded on the original CT including the mandibular body and orbits. Osseous: Comminuted left orbital floor fracture with 3 mm of depression. Fracture of the left lateral orbital wall. Comminuted and depressed fractures of the anterior, posterior, and medial walls of the left maxillary sinus. Less comminuted and displaced fractures of the right maxillary sinus. Bilateral pterygoid plate fractures. Nondisplaced left zygomatic arch fracture. Rozas volume right orbital emphysema potentially indicating a nondisplaced orbital floor or lamina papyracea fracture which is not well seen due to motion. Limited assessment for a nasal bone or mandibular body fracture due to motion. No mandibular dislocation. Orbits: Left greater than right orbital emphysema. Grossly intact globes. Moderate left periorbital hematoma. Hickson volume extraconal hematoma on the left. No herniation of orbital  contents through the left orbital floor fracture. Sinuses: Hemorrhage in both maxillary sinuses and to a lesser extent left ethmoid and left frontal sinuses. Clear mastoid air cells and tympanic cavities. Soft tissues: Left facial soft tissue emphysema. CT CERVICAL SPINE FINDINGS Alignment: Chronic cervical spine straightening.  No listhesis. Skull base and vertebrae: No acute fracture or suspicious osseous lesion. Soft tissues and spinal canal: No prevertebral fluid or swelling. No visible canal hematoma. Disc levels:  Remote C3-4 ACDF. Upper chest: Reported separately. Other: None. IMPRESSION: 1. No evidence of acute intracranial abnormality. 2. Bilateral maxillofacial fractures as above. 3. No acute cervical spine fracture. The studies were reviewed in person with Dr. Violeta Gelinas on 12/01/2019 at 10:15 a.m. Electronically Signed   By: Sebastian Ache M.D.   On: 12/01/2019 11:19    Review of Systems Blood pressure 138/78, pulse 76, temperature 98 F (36.7 C), temperature source Oral, resp. rate 16, height 5\' 9"  (1.753 m), weight 104.3 kg, SpO2 92 %. Physical Exam Constitutional:      Appearance: He is obese.  HENT:     Head:  Comments: He is arouses but not cooperative with questions. He has bruising under the left eye. He has normal eye conjuntiva and pupils. There is no stepoff. No septal hematoma. No occlusive dentition but he is too sedate to test midface.     Nose: Nose normal.  Eyes:     Conjunctiva/sclera: Conjunctivae normal.     Pupils: Pupils are equal, round, and reactive to light.     Assessment/Plan: Facial fractures- he is essentially edenulous so the occlusion is not an issue and the midface does not look like it is a complete LeForte I. He should not need any intervention for the midface or zygomatic fractures. The orbital fracture is minimal displacement but still need to check for diplopia. Will follow up in 1-2 days.  Suzanna ObeyJohn Maddelynn Bradshaw 12/01/2019, 7:20 PM

## 2019-12-01 NOTE — Brief Op Note (Signed)
   12/01/2019  8:36 PM  PATIENT:  Hunter Bradshaw  44 y.o. male  PRE-OPERATIVE DIAGNOSIS:  open right foot fxs, left leg lac  POST-OPERATIVE DIAGNOSIS:  open right foot fxs, left leg lac  PROCEDURE:  Procedure(s): IRRIGATION AND EXCISIONAL DEBRIDEMENT OF RIGHT OPEN CALCANEAL FRACTURE AND BIMALLEOLAR FRACTURE WITH EXTERNAL FIXATION RIGHT ANKLE AND EXCISIONAL DEBRIDEMENT AND CLOSURE OF LACERATION LEFT LEG measuring 6 cm  SURGEON:  Surgeon(s): Marlou Sa, Tonna Corner, MD  ASSISTANT: Annie Main, PA  ANESTHESIA:   general  EBL: 100 ml    No intake/output data recorded.  BLOOD ADMINISTERED: none  DRAINS: none   LOCAL MEDICATIONS USED: MAC powder right calcaneus  SPECIMEN:  No Specimen  COUNTS:  YES  TOURNIQUET:  * No tourniquets in log *  DICTATION: .Other Dictation: Dictation Number [id number]  PLAN OF CARE: Admit to inpatient   PATIENT DISPOSITION:  PACU - hemodynamically stable
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
## 2019-12-01 NOTE — ED Notes (Signed)
Phlebotomy at bedside to do legal draw.
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
## 2019-12-01 NOTE — Procedures (Addendum)
Insertion of Chest Tube Procedure Note  Hunter Bradshaw  [id number]  [birth year]  Date:12/01/19  Time:10:31 AM    Provider Performing: Liz Malady, MD  Assistant - Leary Roca, PA-C  Procedure: L chest tube insertion 21fr  Indication(s) Clinically significant L pneumothorax Consent Unable to obtain consent due to emergent nature of procedure.  Anesthesia Topical only with 1% lidocaine    Time Out Verified patient identification, verified procedure, site/side was marked, verified correct patient position, special equipment/implants available, medications/allergies/relevant history reviewed, required imaging and test results available.   Sterile Technique Maximal sterile technique including full sterile barrier drape, hand hygiene, sterile gown, sterile gloves, mask, hair covering, sterile ultrasound probe cover (if used).   Procedure Description Ultrasound not used to identify appropriate pleural anatomy for placement and overlying skin marked. Area of placement cleaned and draped in sterile fashion.  A 14 French pigtail pleural catheter was placed into the left pleural space using Seldinger technique. Appropriate return of air was obtained.  The tube was connected to atrium and placed on -20 cm H2O wall suction.   Complications/Tolerance None; patient tolerated the procedure well. Chest X-ray is ordered to verify placement.   EBL Minimal  Specimen(s) none   CXR P  Violeta Gelinas, MD, MPH, FACS Please use [email or web address] to contact on call provider

## 2019-12-01 NOTE — ED Notes (Signed)
This RN able to contact pts son Harrold Donath, info added to snapshot, he will be heading to the hospital.

## 2019-12-01 NOTE — Anesthesia Procedure Notes (Addendum)
Procedure Name: Intubation Date/Time: 12/01/2019 12:22 PM Performed by: Demetrio Lapping, CRNA Pre-anesthesia Checklist: Patient identified, Emergency Drugs available, Suction available, Patient being monitored and Timeout performed Patient Re-evaluated:Patient Re-evaluated prior to induction Oxygen Delivery Method: Circle system utilized Preoxygenation: Pre-oxygenation with 100% oxygen Induction Type: IV induction and Rapid sequence Laryngoscope Size: Glidescope and 4 (pt wearing C-Collar) Grade View: Grade I Tube type: Oral Tube size: 7.5 mm Number of attempts: 1 Airway Equipment and Method: Video-laryngoscopy and Rigid stylet Placement Confirmation: ETT inserted through vocal cords under direct vision,  breath sounds checked- equal and bilateral and CO2 detector Secured at: 23 cm Tube secured with: Tape Dental Injury: Teeth and Oropharynx as per pre-operative assessment  Difficulty Due To: Difficult Airway- due to cervical collar
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
## 2019-12-01 NOTE — ED Notes (Signed)
Returned from CT.

## 2019-12-01 NOTE — Anesthesia Preprocedure Evaluation (Addendum)
Anesthesia Evaluation  Patient identified by MRN, date of birth, ID band Patient awake    Reviewed: Allergy & Precautions, NPO status , Patient's Chart, lab work & pertinent test results  History of Anesthesia Complications Negative for: history of anesthetic complications  Airway Mallampati: II       Dental  (+) Poor Dentition, Dental Advisory Given   Pulmonary neg pulmonary ROS,    Pulmonary exam normal        Cardiovascular negative cardio ROS Normal cardiovascular exam     Neuro/Psych C-Collar negative neurological ROS     GI/Hepatic negative GI ROS, Neg liver ROS,   Endo/Other  negative endocrine ROS  Renal/GU negative Renal ROS     Musculoskeletal negative musculoskeletal ROS (+)   Abdominal   Peds  Hematology negative hematology ROS (+)   Anesthesia Other Findings   Reproductive/Obstetrics                           Anesthesia Physical Anesthesia Plan  ASA: III and emergent  Anesthesia Plan: General   Post-op Pain Management:    Induction: Intravenous  PONV Risk Score and Plan: 3 and Ondansetron and Dexamethasone  Airway Management Planned: Oral ETT  Additional Equipment:   Intra-op Plan:   Post-operative Plan: Possible Post-op intubation/ventilation  Informed Consent: I have reviewed the patients History and Physical, chart, labs and discussed the procedure including the risks, benefits and alternatives for the proposed anesthesia with the patient or authorized representative who has indicated his/her understanding and acceptance.     Dental advisory given  Plan Discussed with: Anesthesiologist and CRNA  Anesthesia Plan Comments:       Anesthesia Quick Evaluation

## 2019-12-01 NOTE — Transfer of Care (Signed)
Immediate Anesthesia Transfer of Care Note  Patient: Hunter Bradshaw  Procedure(s) Performed: IRRIGATION AND DEBRIDEMENT OF LEG (Bilateral ) EXTERNAL FIXATION LEG (Right )  Patient Location: PACU  Anesthesia Type:General  Level of Consciousness: alert , patient cooperative and confused  Airway & Oxygen Therapy: Patient Spontanous Breathing and Patient connected to face mask oxygen  Post-op Assessment: Report given to RN, Post -op Vital signs reviewed and stable and Patient moving all extremities X 4  Post vital signs: Reviewed and stable  Last Vitals:  Vitals Value Taken Time  BP 119/74 12/01/19 1416  Temp    Pulse 78 12/01/19 1418  Resp 22 12/01/19 1418  SpO2 99 % 12/01/19 1418  Vitals shown include unvalidated device data.  Last Pain:  Vitals:   12/01/19 0928  TempSrc: Temporal         Complications: No complications documented.
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
## 2019-12-02 ENCOUNTER — Inpatient Hospital Stay (HOSPITAL_COMMUNITY): Payer: Medicaid Other

## 2019-12-02 ENCOUNTER — Encounter (HOSPITAL_COMMUNITY): Payer: Self-pay | Admitting: Orthopedic Surgery

## 2019-12-02 LAB — BASIC METABOLIC PANEL
Anion gap: 5 (ref 5–15)
BUN: 16 mg/dL (ref 6–20)
CO2: 24 mmol/L (ref 22–32)
Calcium: 7.8 mg/dL — ABNORMAL LOW (ref 8.9–10.3)
Chloride: 104 mmol/L (ref 98–111)
Creatinine, Ser: 0.81 mg/dL (ref 0.61–1.24)
GFR calc Af Amer: >60 mL/min (ref >60)
GFR calc non Af Amer: >60 mL/min (ref >60)
Glucose, Bld: 116 mg/dL — ABNORMAL HIGH (ref 70–99)
Potassium: 5.5 mmol/L — ABNORMAL HIGH (ref 3.5–5.1)
Sodium: 133 mmol/L — ABNORMAL LOW (ref 135–145)

## 2019-12-02 LAB — URINALYSIS, ROUTINE W REFLEX MICROSCOPIC
Bilirubin Urine: NEGATIVE
Glucose, UA: NEGATIVE mg/dL
Hgb urine dipstick: NEGATIVE
Ketones, ur: NEGATIVE mg/dL
Leukocytes,Ua: NEGATIVE
Nitrite: NEGATIVE
Protein, ur: NEGATIVE mg/dL
Specific Gravity, Urine: 1.006 (ref 1.005–1.030)
pH: 6 (ref 5.0–8.0)

## 2019-12-02 LAB — CBC
HCT: 28.3 % — ABNORMAL LOW (ref 39.0–52.0)
Hemoglobin: 9.4 g/dL — ABNORMAL LOW (ref 13.0–17.0)
MCH: 32.1 pg (ref 26.0–34.0)
MCHC: 33.2 g/dL (ref 30.0–36.0)
MCV: 96.6 fL (ref 80.0–100.0)
Platelets: 142 10*3/uL — ABNORMAL LOW (ref 150–400)
RBC: 2.93 MIL/uL — ABNORMAL LOW (ref 4.22–5.81)
RDW: 14.6 % (ref 11.5–15.5)
WBC: 10.9 10*3/uL — ABNORMAL HIGH (ref 4.0–10.5)
nRBC: 0 % (ref 0.0–0.2)

## 2019-12-02 LAB — SAMPLE TO BLOOD BANK

## 2019-12-02 LAB — MRSA PCR SCREENING: MRSA by PCR: NEGATIVE

## 2019-12-02 MED ORDER — GUAIFENESIN ER 600 MG PO TB12
600.0000 mg | ORAL_TABLET | Freq: Two times a day (BID) | ORAL | Status: DC | PRN
  Administered 2019-12-02 – 2019-12-05 (×5): 600 mg via ORAL
  Filled 2019-12-02 (×6): qty 1

## 2019-12-02 MED ORDER — ACETAMINOPHEN 500 MG PO TABS
1000.0000 mg | ORAL_TABLET | Freq: Four times a day (QID) | ORAL | Status: DC
  Administered 2019-12-02 – 2019-12-06 (×11): 1000 mg via ORAL
  Filled 2019-12-02 (×13): qty 2

## 2019-12-02 MED ORDER — FUROSEMIDE 10 MG/ML IJ SOLN
40.0000 mg | Freq: Once | INTRAMUSCULAR | Status: AC
  Administered 2019-12-02: 40 mg via INTRAVENOUS
  Filled 2019-12-02: qty 4

## 2019-12-02 MED ORDER — SODIUM CHLORIDE 0.9 % IV SOLN: INTRAVENOUS | Status: DC

## 2019-12-02 MED ORDER — MIDAZOLAM HCL 2 MG/2ML IJ SOLN
2.0000 mg | Freq: Once | INTRAMUSCULAR | Status: AC
  Administered 2019-12-02: 1 mg via INTRAVENOUS
  Filled 2019-12-02: qty 2

## 2019-12-02 MED ORDER — FENTANYL CITRATE (PF) 100 MCG/2ML IJ SOLN
100.0000 ug | Freq: Once | INTRAMUSCULAR | Status: AC
  Administered 2019-12-02: 100 ug via INTRAVENOUS
  Filled 2019-12-02: qty 2

## 2019-12-02 MED ORDER — METHOCARBAMOL 500 MG PO TABS
500.0000 mg | ORAL_TABLET | Freq: Four times a day (QID) | ORAL | Status: DC | PRN
  Administered 2019-12-02 – 2019-12-06 (×6): 500 mg via ORAL
  Filled 2019-12-02 (×7): qty 1
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
## 2019-12-02 NOTE — Progress Notes (Signed)
Tele notified this RN that the patient had been in junctional rhythm and at one point his HR briefly dropped to 49. HR now in the 80s NSR. MD notified.

## 2019-12-02 NOTE — Anesthesia Postprocedure Evaluation (Signed)
Anesthesia Post Note  Patient: Hunter Bradshaw  Procedure(s) Performed: IRRIGATION AND DEBRIDEMENT OF LEG (Bilateral ) EXTERNAL FIXATION LEG (Right )     Patient location during evaluation: PACU Anesthesia Type: General Level of consciousness: sedated Pain management: pain level controlled Vital Signs Assessment: post-procedure vital signs reviewed and stable Respiratory status: spontaneous breathing and respiratory function stable Cardiovascular status: stable Postop Assessment: no apparent nausea or vomiting Anesthetic complications: no   No complications documented.                 Anallely Rosell DANIEL
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
## 2019-12-02 NOTE — Progress Notes (Signed)
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 any intervention tot he orbital fracture unless develops any symptoms.   LOS: 1 day    Suzanna ObeyJohn Athziry Millican 12/02/2019

## 2019-12-02 NOTE — Procedures (Signed)
Insertion of Chest Tube Procedure Note  JONA ZAPPONE  [id number]  1976-01-13  Date:12/02/19  Time:9:52 AM    Provider Performing: Juliet Rude   Procedure: Chest Tube Insertion [phone])  Indication(s) Pneumothorax  Consent Risks of the procedure as well as the alternatives and risks of each were explained to the patient and/or caregiver.  Consent for the procedure was obtained and is signed in the bedside chart  Anesthesia Topical only with 1% lidocaine  Patient given 1 mg versed and 50 mcg fentanyl prior to procedure. Another 50 mcg fentanyl given during procedure.   Time Out Verified patient identification, verified procedure, site/side was marked, verified correct patient position, special equipment/implants available, medications/allergies/relevant history reviewed, required imaging and test results available.   Sterile Technique Maximal sterile technique including full sterile barrier drape, hand hygiene, sterile gown, sterile gloves, mask, hair covering, sterile ultrasound probe cover (if used).   Procedure Description Ultrasound not used to identify appropriate pleural anatomy for placement and overlying skin marked. Area of placement cleaned and draped in sterile fashion.  A 14 French pigtail pleural catheter was placed into the right pleural space using Seldinger technique. Appropriate return of air was obtained.  The tube was connected to atrium and placed on -20 cm H2O wall suction.   Complications/Tolerance None; patient tolerated the procedure well. Chest X-ray is ordered to verify placement.   EBL Minimal  Specimen(s) none   Juliet Rude , [facility] Surgery 12/02/2019, 9:53 AM Please see Amion for pager number during day hours 7:00am-4:30pm

## 2019-12-02 NOTE — Progress Notes (Signed)
Rehab Admissions Coordinator Note:  Patient was screened by Clois Dupes for appropriateness for an Inpatient Acute Rehab Consult per PT and OT recs..  At this time, we are recommending Inpatient Rehab consult. I will place order per protocol.  Clois Dupes RN MSN 12/02/2019, 4:46 PM  I can be reached at [phone].

## 2019-12-02 NOTE — Anesthesia Preprocedure Evaluation (Addendum)
Anesthesia Evaluation  Patient identified by MRN, date of birth, ID band Patient awake    Reviewed: Allergy & Precautions, NPO status , Patient's Chart, lab work & pertinent test results  History of Anesthesia Complications Negative for: history of anesthetic complications  Airway Mallampati: III  TM Distance: >3 FB Neck ROM: Full    Dental  (+) Dental Advisory Given, Poor Dentition, Missing   Pulmonary neg pulmonary ROS, Patient abstained from smoking.,    Pulmonary exam normal        Cardiovascular negative cardio ROS Normal cardiovascular exam     Neuro/Psych negative neurological ROS  negative psych ROS   GI/Hepatic negative GI ROS, (+)     substance abuse  marijuana use,  Elevated LFTs    Endo/Other   Obesity   Renal/GU negative Renal ROS     Musculoskeletal negative musculoskeletal ROS (+)   Abdominal   Peds  Hematology  (+) anemia ,  Thrombocytopenia    Anesthesia Other Findings Covid test negative   Reproductive/Obstetrics                           Anesthesia Physical Anesthesia Plan  ASA: II  Anesthesia Plan: General   Post-op Pain Management:    Induction: Intravenous  PONV Risk Score and Plan: 3 and Treatment may vary due to age or medical condition, Ondansetron, Midazolam and Dexamethasone  Airway Management Planned: Oral ETT  Additional Equipment: None  Intra-op Plan:   Post-operative Plan: Extubation in OR  Informed Consent: I have reviewed the patients History and Physical, chart, labs and discussed the procedure including the risks, benefits and alternatives for the proposed anesthesia with the patient or authorized representative who has indicated his/her understanding and acceptance.     Dental advisory given  Plan Discussed with: CRNA and Anesthesiologist  Anesthesia Plan Comments:        Anesthesia Quick Evaluation

## 2019-12-03 ENCOUNTER — Inpatient Hospital Stay (HOSPITAL_COMMUNITY): Payer: Medicaid Other

## 2019-12-03 ENCOUNTER — Inpatient Hospital Stay (HOSPITAL_COMMUNITY): Payer: Medicaid Other | Admitting: Anesthesiology

## 2019-12-03 ENCOUNTER — Encounter (HOSPITAL_COMMUNITY): Payer: Self-pay | Admitting: General Surgery

## 2019-12-03 ENCOUNTER — Encounter (HOSPITAL_COMMUNITY): Admission: EM | Discharge status: Against Medical Advice | Payer: Self-pay | Source: Home / Self Care

## 2019-12-03 HISTORY — PX: ORIF CALCANEOUS FRACTURE: SHX5030

## 2019-12-03 HISTORY — PX: I & D EXTREMITY: SHX5045

## 2019-12-03 HISTORY — PX: EXTERNAL FIXATION REMOVAL: SHX5040

## 2019-12-03 LAB — RAPID URINE DRUG SCREEN, HOSP PERFORMED
Amphetamines: POSITIVE — AB
Barbiturates: NOT DETECTED
Benzodiazepines: POSITIVE — AB
Cocaine: NOT DETECTED
Opiates: POSITIVE — AB
Tetrahydrocannabinol: POSITIVE — AB

## 2019-12-03 LAB — POCT I-STAT, CHEM 8
BUN: 11 mg/dL (ref 6–20)
Calcium, Ion: 1.1 mmol/L — ABNORMAL LOW (ref 1.15–1.40)
Chloride: 100 mmol/L (ref 98–111)
Creatinine, Ser: 0.5 mg/dL — ABNORMAL LOW (ref 0.61–1.24)
Glucose, Bld: 111 mg/dL — ABNORMAL HIGH (ref 70–99)
HCT: 30 % — ABNORMAL LOW (ref 39.0–52.0)
Hemoglobin: 10.2 g/dL — ABNORMAL LOW (ref 13.0–17.0)
Potassium: 4 mmol/L (ref 3.5–5.1)
Sodium: 136 mmol/L (ref 135–145)
TCO2: 21 mmol/L — ABNORMAL LOW (ref 22–32)

## 2019-12-03 LAB — CBC
HCT: 30.5 % — ABNORMAL LOW (ref 39.0–52.0)
Hemoglobin: 10.2 g/dL — ABNORMAL LOW (ref 13.0–17.0)
MCH: 31.8 pg (ref 26.0–34.0)
MCHC: 33.4 g/dL (ref 30.0–36.0)
MCV: 95 fL (ref 80.0–100.0)
Platelets: 109 10*3/uL — ABNORMAL LOW (ref 150–400)
RBC: 3.21 MIL/uL — ABNORMAL LOW (ref 4.22–5.81)
RDW: 14.6 % (ref 11.5–15.5)
WBC: 11.1 10*3/uL — ABNORMAL HIGH (ref 4.0–10.5)
nRBC: 0 % (ref 0.0–0.2)

## 2019-12-03 LAB — BASIC METABOLIC PANEL
Anion gap: 8 (ref 5–15)
BUN: 8 mg/dL (ref 6–20)
CO2: 21 mmol/L — ABNORMAL LOW (ref 22–32)
Calcium: 7.9 mg/dL — ABNORMAL LOW (ref 8.9–10.3)
Chloride: 101 mmol/L (ref 98–111)
Creatinine, Ser: 0.78 mg/dL (ref 0.61–1.24)
GFR calc Af Amer: >60 mL/min (ref >60)
GFR calc non Af Amer: >60 mL/min (ref >60)
Glucose, Bld: 175 mg/dL — ABNORMAL HIGH (ref 70–99)
Potassium: 4.3 mmol/L (ref 3.5–5.1)
Sodium: 130 mmol/L — ABNORMAL LOW (ref 135–145)

## 2019-12-03 SURGERY — IRRIGATION AND DEBRIDEMENT EXTREMITY
Anesthesia: General | Site: Leg Lower | Laterality: Right

## 2019-12-03 MED ORDER — 0.9 % SODIUM CHLORIDE (POUR BTL) OPTIME
TOPICAL | Status: DC | PRN
  Administered 2019-12-03: 1000 mL

## 2019-12-03 MED ORDER — CHLORHEXIDINE GLUCONATE 0.12 % MT SOLN
OROMUCOSAL | Status: AC
  Administered 2019-12-03: 15 mL via OROMUCOSAL
  Filled 2019-12-03: qty 15

## 2019-12-03 MED ORDER — ROCURONIUM BROMIDE 10 MG/ML (PF) SYRINGE
PREFILLED_SYRINGE | INTRAVENOUS | Status: AC
  Filled 2019-12-03: qty 10

## 2019-12-03 MED ORDER — SUGAMMADEX SODIUM 200 MG/2ML IV SOLN
INTRAVENOUS | Status: DC | PRN
  Administered 2019-12-03: 200 mg via INTRAVENOUS

## 2019-12-03 MED ORDER — MIDAZOLAM HCL 2 MG/2ML IJ SOLN
INTRAMUSCULAR | Status: AC
  Filled 2019-12-03: qty 2

## 2019-12-03 MED ORDER — ONDANSETRON HCL 4 MG/2ML IJ SOLN
INTRAMUSCULAR | Status: DC | PRN
  Administered 2019-12-03: 4 mg via INTRAVENOUS

## 2019-12-03 MED ORDER — CEFAZOLIN SODIUM-DEXTROSE 2-4 GM/100ML-% IV SOLN
INTRAVENOUS | Status: AC
  Filled 2019-12-03: qty 100

## 2019-12-03 MED ORDER — DEXAMETHASONE SODIUM PHOSPHATE 10 MG/ML IJ SOLN
INTRAMUSCULAR | Status: AC
  Filled 2019-12-03: qty 1

## 2019-12-03 MED ORDER — CEFAZOLIN SODIUM-DEXTROSE 2-4 GM/100ML-% IV SOLN
2.0000 g | Freq: Three times a day (TID) | INTRAVENOUS | Status: AC
  Administered 2019-12-03 – 2019-12-04 (×3): 2 g via INTRAVENOUS
  Filled 2019-12-03 (×3): qty 100

## 2019-12-03 MED ORDER — VANCOMYCIN HCL 1000 MG IV SOLR
INTRAVENOUS | Status: DC | PRN
  Administered 2019-12-03: 1000 mg

## 2019-12-03 MED ORDER — FENTANYL CITRATE (PF) 100 MCG/2ML IJ SOLN: 25.0000 ug | INTRAMUSCULAR | Status: DC | PRN

## 2019-12-03 MED ORDER — SODIUM CHLORIDE 0.9 % IR SOLN
Status: DC | PRN
  Administered 2019-12-03: 3000 mL

## 2019-12-03 MED ORDER — VANCOMYCIN HCL 1000 MG IV SOLR
INTRAVENOUS | Status: AC
  Filled 2019-12-03: qty 1000

## 2019-12-03 MED ORDER — OXYCODONE HCL 5 MG PO TABS: 5.0000 mg | ORAL_TABLET | Freq: Once | ORAL | Status: DC | PRN

## 2019-12-03 MED ORDER — ONDANSETRON HCL 4 MG/2ML IJ SOLN
INTRAMUSCULAR | Status: AC
  Filled 2019-12-03: qty 2

## 2019-12-03 MED ORDER — FENTANYL CITRATE (PF) 250 MCG/5ML IJ SOLN
INTRAMUSCULAR | Status: AC
  Filled 2019-12-03: qty 5

## 2019-12-03 MED ORDER — PROPOFOL 10 MG/ML IV BOLUS
INTRAVENOUS | Status: AC
  Filled 2019-12-03: qty 40

## 2019-12-03 MED ORDER — LIDOCAINE 2% (20 MG/ML) 5 ML SYRINGE
INTRAMUSCULAR | Status: AC
  Filled 2019-12-03: qty 5

## 2019-12-03 MED ORDER — PROPOFOL 10 MG/ML IV BOLUS
INTRAVENOUS | Status: DC | PRN
  Administered 2019-12-03: 160 mg via INTRAVENOUS

## 2019-12-03 MED ORDER — CHLORHEXIDINE GLUCONATE 0.12 % MT SOLN
15.0000 mL | OROMUCOSAL | Status: AC
  Filled 2019-12-03: qty 15

## 2019-12-03 MED ORDER — PROMETHAZINE HCL 25 MG/ML IJ SOLN: 6.2500 mg | INTRAMUSCULAR | Status: DC | PRN

## 2019-12-03 MED ORDER — ROCURONIUM BROMIDE 10 MG/ML (PF) SYRINGE
PREFILLED_SYRINGE | INTRAVENOUS | Status: DC | PRN
  Administered 2019-12-03: 80 mg via INTRAVENOUS
  Administered 2019-12-03 (×2): 20 mg via INTRAVENOUS
  Administered 2019-12-03: 10 mg via INTRAVENOUS
  Administered 2019-12-03: 20 mg via INTRAVENOUS

## 2019-12-03 MED ORDER — OXYCODONE HCL 5 MG/5ML PO SOLN: 5.0000 mg | Freq: Once | ORAL | Status: DC | PRN

## 2019-12-03 MED ORDER — FENTANYL CITRATE (PF) 100 MCG/2ML IJ SOLN
INTRAMUSCULAR | Status: DC | PRN
  Administered 2019-12-03 (×2): 50 ug via INTRAVENOUS
  Administered 2019-12-03: 100 ug via INTRAVENOUS
  Administered 2019-12-03 (×2): 50 ug via INTRAVENOUS

## 2019-12-03 MED ORDER — LIDOCAINE 2% (20 MG/ML) 5 ML SYRINGE
INTRAMUSCULAR | Status: DC | PRN
  Administered 2019-12-03: 60 mg via INTRAVENOUS

## 2019-12-03 MED ORDER — DEXAMETHASONE SODIUM PHOSPHATE 10 MG/ML IJ SOLN
INTRAMUSCULAR | Status: DC | PRN
  Administered 2019-12-03: 10 mg via INTRAVENOUS

## 2019-12-03 MED ORDER — LACTATED RINGERS IV SOLN: INTRAVENOUS | Status: DC

## 2019-12-03 MED ORDER — KETAMINE HCL 50 MG/5ML IJ SOSY
PREFILLED_SYRINGE | INTRAMUSCULAR | Status: AC
  Filled 2019-12-03: qty 5

## 2019-12-03 MED ORDER — DEXMEDETOMIDINE (PRECEDEX) IN NS 20 MCG/5ML (4 MCG/ML) IV SYRINGE
PREFILLED_SYRINGE | INTRAVENOUS | Status: DC | PRN
  Administered 2019-12-03: 4 ug via INTRAVENOUS
  Administered 2019-12-03 (×3): 8 ug via INTRAVENOUS

## 2019-12-03 MED ORDER — MIDAZOLAM HCL 2 MG/2ML IJ SOLN
INTRAMUSCULAR | Status: DC | PRN
  Administered 2019-12-03: 2 mg via INTRAVENOUS

## 2019-12-03 SURGICAL SUPPLY — 78 items
BANDAGE ESMARK 6X9 LF (GAUZE/BANDAGES/DRESSINGS) ×1 IMPLANT
BIT DRILL 2.4 AO COUPLING CANN (BIT) ×3 IMPLANT
BIT DRILL 4.8X200 CANN (BIT) ×3 IMPLANT
BLADE SURG 10 STRL SS (BLADE) ×3 IMPLANT
BNDG COHESIVE 4X5 TAN STRL (GAUZE/BANDAGES/DRESSINGS) IMPLANT
BNDG ELASTIC 4X5.8 VLCR STR LF (GAUZE/BANDAGES/DRESSINGS) ×3 IMPLANT
BNDG ELASTIC 6X5.8 VLCR STR LF (GAUZE/BANDAGES/DRESSINGS) ×3 IMPLANT
BNDG ESMARK 6X9 LF (GAUZE/BANDAGES/DRESSINGS) ×3
BNDG GAUZE ELAST 4 BULKY (GAUZE/BANDAGES/DRESSINGS) IMPLANT
BRUSH SCRUB EZ PLAIN DRY (MISCELLANEOUS) ×6 IMPLANT
COVER MAYO STAND STRL (DRAPES) IMPLANT
COVER SURGICAL LIGHT HANDLE (MISCELLANEOUS) ×3 IMPLANT
COVER WAND RF STERILE (DRAPES) ×3 IMPLANT
DRAIN TLS ROUND 10FR (DRAIN) IMPLANT
DRAPE C-ARM 42X72 X-RAY (DRAPES) ×3 IMPLANT
DRAPE C-ARMOR (DRAPES) ×3 IMPLANT
DRAPE ORTHO SPLIT 77X108 STRL (DRAPES) ×3
DRAPE SURG ORHT 6 SPLT 77X108 (DRAPES) ×1 IMPLANT
DRAPE U-SHAPE 47X51 STRL (DRAPES) ×3 IMPLANT
DRSG ADAPTIC 3X8 NADH LF (GAUZE/BANDAGES/DRESSINGS) IMPLANT
DRSG EMULSION OIL 3X3 NADH (GAUZE/BANDAGES/DRESSINGS) ×3 IMPLANT
ELECT REM PT RETURN 9FT ADLT (ELECTROSURGICAL) ×3
ELECTRODE REM PT RTRN 9FT ADLT (ELECTROSURGICAL) ×1 IMPLANT
GAUZE SPONGE 4X4 12PLY STRL (GAUZE/BANDAGES/DRESSINGS) ×3 IMPLANT
GLOVE BIO SURGEON STRL SZ7.5 (GLOVE) ×9 IMPLANT
GLOVE BIO SURGEON STRL SZ8 (GLOVE) ×6 IMPLANT
GLOVE BIOGEL PI IND STRL 7.5 (GLOVE) ×2 IMPLANT
GLOVE BIOGEL PI IND STRL 8 (GLOVE) ×2 IMPLANT
GLOVE BIOGEL PI IND STRL 9 (GLOVE) ×1 IMPLANT
GLOVE BIOGEL PI INDICATOR 7.5 (GLOVE) ×4
GLOVE BIOGEL PI INDICATOR 8 (GLOVE) ×4
GLOVE BIOGEL PI INDICATOR 9 (GLOVE) ×2
GOWN STRL REUS W/ TWL LRG LVL3 (GOWN DISPOSABLE) ×3 IMPLANT
GOWN STRL REUS W/ TWL XL LVL3 (GOWN DISPOSABLE) ×1 IMPLANT
GOWN STRL REUS W/TWL LRG LVL3 (GOWN DISPOSABLE) ×9
GOWN STRL REUS W/TWL XL LVL3 (GOWN DISPOSABLE) ×3
HALF PIN 5.0X160 (EXFIX) ×3 IMPLANT
HANDPIECE INTERPULSE COAX TIP (DISPOSABLE)
K-WIRE ACE 1.6X6 (WIRE) ×3
KIT BASIN OR (CUSTOM PROCEDURE TRAY) ×3 IMPLANT
KIT TURNOVER KIT B (KITS) ×3 IMPLANT
KWIRE ACE 1.6X6 (WIRE) ×1 IMPLANT
MANIFOLD NEPTUNE II (INSTRUMENTS) ×3 IMPLANT
NEEDLE HYPO 21X1.5 SAFETY (NEEDLE) IMPLANT
NS IRRIG 1000ML POUR BTL (IV SOLUTION) ×3 IMPLANT
PACK ORTHO EXTREMITY (CUSTOM PROCEDURE TRAY) ×3 IMPLANT
PAD ARMBOARD 7.5X6 YLW CONV (MISCELLANEOUS) ×6 IMPLANT
PAD CAST 4YDX4 CTTN HI CHSV (CAST SUPPLIES) ×1 IMPLANT
PADDING CAST COTTON 4X4 STRL (CAST SUPPLIES) ×3
PADDING CAST COTTON 6X4 STRL (CAST SUPPLIES) ×3 IMPLANT
PIN GUIDE DRILL TIP 2.8X300 (DRILL) ×6 IMPLANT
PREVENA RESTOR AXIOFORM 29X28 (GAUZE/BANDAGES/DRESSINGS) ×3 IMPLANT
SCREW CANN  FULL THD 6.5X75 (Screw) ×6 IMPLANT
SCREW CANN FULL THD 6.5X75 (Screw) ×2 IMPLANT
SCREW CANNULATED 6.5X75MM (Screw) ×3 IMPLANT
SCREW PT 4.0X26MM (Screw) ×3 IMPLANT
SCREW SHANZ 4.0X60MM (EXFIX) ×3 IMPLANT
SET HNDPC FAN SPRY TIP SCT (DISPOSABLE) IMPLANT
SOL PREP POV-IOD 4OZ 10% (MISCELLANEOUS) ×3 IMPLANT
SOL PREP PROV IODINE SCRUB 4OZ (MISCELLANEOUS) ×3 IMPLANT
SPONGE LAP 18X18 RF (DISPOSABLE) ×3 IMPLANT
STOCKINETTE IMPERVIOUS 9X36 MD (GAUZE/BANDAGES/DRESSINGS) IMPLANT
SUCTION FRAZIER HANDLE 10FR (MISCELLANEOUS) ×3
SUCTION TUBE FRAZIER 10FR DISP (MISCELLANEOUS) ×1 IMPLANT
SUT ETHILON 2 0 FS 18 (SUTURE) ×6 IMPLANT
SUT ETHILON 3 0 PS 1 (SUTURE) ×9 IMPLANT
SUT PDS AB 2-0 CT1 27 (SUTURE) ×6 IMPLANT
SUT VIC AB 2-0 CT3 27 (SUTURE) IMPLANT
SUT VIC AB 2-0 SH 18 (SUTURE) IMPLANT
SUT VIC AB 3-0 FS2 27 (SUTURE) ×3 IMPLANT
SYR CONTROL 10ML LL (SYRINGE) IMPLANT
TOWEL GREEN STERILE (TOWEL DISPOSABLE) ×6 IMPLANT
TOWEL GREEN STERILE FF (TOWEL DISPOSABLE) ×3 IMPLANT
TUBE CONNECTING 12'X1/4 (SUCTIONS) ×1
TUBE CONNECTING 12X1/4 (SUCTIONS) ×2 IMPLANT
UNDERPAD 30X36 HEAVY ABSORB (UNDERPADS AND DIAPERS) ×3 IMPLANT
WATER STERILE IRR 1000ML POUR (IV SOLUTION) ×3 IMPLANT
YANKAUER SUCT BULB TIP NO VENT (SUCTIONS) ×3 IMPLANT

## 2019-12-03 NOTE — Progress Notes (Signed)
PT Cancellation Note  Patient Details Name: ORAL REMACHE MRN: [id number] DOB: Nov 21, 1975   Cancelled Treatment:    Reason Eval/Treat Not Completed: Patient at procedure or test/unavailable; pt in OR, will attempt another day.   Elray Mcgregor 12/03/2019, 12:13 PM  Sheran Lawless, PT Acute Rehabilitation Services Pager:[phone] Office:[phone] 12/03/2019

## 2019-12-03 NOTE — Anesthesia Procedure Notes (Signed)
Procedure Name: Intubation Date/Time: 12/03/2019 8:44 AM Performed by: Leonor Liv, CRNA Pre-anesthesia Checklist: Patient identified, Emergency Drugs available, Suction available and Patient being monitored Patient Re-evaluated:Patient Re-evaluated prior to induction Oxygen Delivery Method: Circle System Utilized Preoxygenation: Pre-oxygenation with 100% oxygen Induction Type: IV induction Ventilation: Mask ventilation without difficulty Laryngoscope Size: Mac and 4 Grade View: Grade I Tube type: Oral Tube size: 7.5 mm Number of attempts: 1 Airway Equipment and Method: Stylet and Oral airway Placement Confirmation: ETT inserted through vocal cords under direct vision,  positive ETCO2 and breath sounds checked- equal and bilateral Secured at: 23 cm Tube secured with: Tape Dental Injury: Teeth and Oropharynx as per pre-operative assessment

## 2019-12-03 NOTE — TOC CAGE-AID Note (Signed)
Transition of Care [facility]) - CAGE-AID Screening   Patient Details  Name: Hunter Bradshaw MRN: [id number] Date of Birth: April 16, 1976  Transition of [facility] CM/SW Contact:    Emeterio Reeve, [place] Phone Number: 12/03/2019, 3:17 PM   Clinical Narrative:  CSW met with pt at bedside. CSW introduced self and explained her role at the hospital.  Pt denied alcohol use and substance use.   CAGE-AID Screening:    Have You Ever Felt You Ought to Cut Down on Your Drinking or Drug Use?: No Have People Annoyed You By Critizing Your Drinking Or Drug Use?: No Have You Felt Bad Or Guilty About Your Drinking Or Drug Use?: No Have You Ever Had a Drink or Used Drugs First Thing In The Morning to Steady Your Nerves or to Get Rid of a Hangover?: No CAGE-AID Score: 0  Substance Abuse Education Offered: Yes    Blima Ledger, [place] Social Worker [phone]

## 2019-12-03 NOTE — Transfer of Care (Signed)
Immediate Anesthesia Transfer of Care Note  Patient: Hunter Bradshaw  Procedure(s) Performed: IRRIGATION AND DEBRIDEMENT OF open ankle fracture (Right Leg Lower) OPEN REDUCTION INTERNAL FIXATION (ORIF) CALCANEOUS FRACTURE (Right Leg Lower) REMOVAL EXTERNAL FIXATION LEG (Right Leg Lower)  Patient Location: PACU  Anesthesia Type:General  Level of Consciousness: awake, alert , oriented and patient cooperative  Airway & Oxygen Therapy: Patient Spontanous Breathing and Patient connected to nasal cannula oxygen  Post-op Assessment: Report given to RN and Post -op Vital signs reviewed and stable  Post vital signs: Reviewed and stable  Last Vitals:  Vitals Value Taken Time  BP 143/76 12/03/19 1149  Temp    Pulse 67 12/03/19 1152  Resp 18 12/03/19 1152  SpO2 94 % 12/03/19 1152  Vitals shown include unvalidated device data.  Last Pain:  Vitals:   12/03/19 0400  TempSrc:   PainSc: Asleep      Patients Stated Pain Goal: 2 (12/02/19 2002)  Complications: No complications documented.
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
## 2019-12-03 NOTE — Progress Notes (Signed)
Inpatient Rehabilitation-Admissions Coordinator   CIR consult received. Noted pt in OR today. AC will follow for therapy reassessments. Will follow up Monday.   Cheri Rous, OTR/L  Rehab Admissions Coordinator  [phone] 12/03/2019 11:14 AM
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
## 2019-12-03 NOTE — Progress Notes (Signed)
Pt transported to CT at 0650.This RN communicated this to OR nurse.

## 2019-12-03 NOTE — Anesthesia Postprocedure Evaluation (Signed)
Anesthesia Post Note  Patient: Hunter Bradshaw  Procedure(s) Performed: IRRIGATION AND DEBRIDEMENT OF open ankle fracture (Right Leg Lower) OPEN REDUCTION INTERNAL FIXATION (ORIF) CALCANEOUS FRACTURE (Right Leg Lower) REMOVAL EXTERNAL FIXATION LEG (Right Leg Lower)     Patient location during evaluation: PACU Anesthesia Type: General Level of consciousness: awake and alert Pain management: pain level controlled Vital Signs Assessment: post-procedure vital signs reviewed and stable Respiratory status: spontaneous breathing, nonlabored ventilation, respiratory function stable and patient connected to nasal cannula oxygen Cardiovascular status: blood pressure returned to baseline and stable Postop Assessment: no apparent nausea or vomiting Anesthetic complications: no   No complications documented.  Last Vitals:  Vitals:   12/03/19 1317 12/03/19 1342  BP: (!) 148/88 (!) 146/84  Pulse: 70 89  Resp: 18 19  Temp:  36.8 C  SpO2: 97% 92%    Last Pain:  Vitals:   12/03/19 1342  TempSrc: Oral  PainSc:                  [name] DAVID

## 2019-12-04 ENCOUNTER — Encounter (HOSPITAL_COMMUNITY): Payer: Self-pay | Admitting: General Surgery

## 2019-12-04 ENCOUNTER — Other Ambulatory Visit: Payer: Self-pay

## 2019-12-04 ENCOUNTER — Inpatient Hospital Stay (HOSPITAL_COMMUNITY): Payer: Medicaid Other

## 2019-12-04 MED ORDER — FUROSEMIDE 10 MG/ML IJ SOLN
20.0000 mg | Freq: Once | INTRAMUSCULAR | Status: AC
  Administered 2019-12-04: 20 mg via INTRAVENOUS
  Filled 2019-12-04: qty 2

## 2019-12-04 NOTE — Progress Notes (Signed)
Pt requested nurse to get belongings from security to be given to son, upon this writer returning to room with security envelope son had left. This nurse and another nurse witness and counted money inside patients wallet yielding the amount of $1389, patient siened security paper, instructed nurse to place with other belongings inside of a patient belongings bag in closet  And stated " I will give it to my girlfriend when she comes this is my bill money you can tell I don't like banks."  Witness: Tracie Harrier

## 2019-12-04 NOTE — Plan of Care (Signed)
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
## 2019-12-05 ENCOUNTER — Inpatient Hospital Stay (HOSPITAL_COMMUNITY): Payer: Medicaid Other

## 2019-12-05 LAB — BASIC METABOLIC PANEL
Anion gap: 8 (ref 5–15)
BUN: 10 mg/dL (ref 6–20)
CO2: 21 mmol/L — ABNORMAL LOW (ref 22–32)
Calcium: 7.7 mg/dL — ABNORMAL LOW (ref 8.9–10.3)
Chloride: 103 mmol/L (ref 98–111)
Creatinine, Ser: 0.69 mg/dL (ref 0.61–1.24)
GFR calc Af Amer: >60 mL/min (ref >60)
GFR calc non Af Amer: >60 mL/min (ref >60)
Glucose, Bld: 104 mg/dL — ABNORMAL HIGH (ref 70–99)
Potassium: 3.4 mmol/L — ABNORMAL LOW (ref 3.5–5.1)
Sodium: 132 mmol/L — ABNORMAL LOW (ref 135–145)

## 2019-12-05 MED ORDER — HYDROMORPHONE HCL 1 MG/ML IJ SOLN: 0.5000 mg | INTRAMUSCULAR | Status: DC | PRN

## 2019-12-05 MED ORDER — POTASSIUM CHLORIDE 20 MEQ PO PACK
40.0000 meq | PACK | Freq: Once | ORAL | Status: AC
  Administered 2019-12-05: 40 meq via ORAL
  Filled 2019-12-05: qty 2

## 2019-12-05 NOTE — Plan of Care (Signed)
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
## 2019-12-06 ENCOUNTER — Encounter (HOSPITAL_COMMUNITY): Payer: Self-pay | Admitting: Orthopedic Surgery

## 2019-12-06 LAB — RAPID URINE DRUG SCREEN, HOSP PERFORMED
Amphetamines: POSITIVE — AB
Barbiturates: NOT DETECTED
Benzodiazepines: NOT DETECTED
Cocaine: NOT DETECTED
Opiates: POSITIVE — AB
Tetrahydrocannabinol: POSITIVE — AB
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
## 2019-12-06 NOTE — Progress Notes (Signed)
PT Cancellation Note  Patient Details Name: Hunter Bradshaw MRN: [id number] DOB: [birth year]   Cancelled Treatment:    Reason Eval/Treat Not Completed: Other (comment). Pt has left AMA.  [name], PT, DPT Acute Rehabilitation Services Pager #: [phone] Office #: [phone]    [name] 12/06/2019, 12:19 PM
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
## 2019-12-07 ENCOUNTER — Encounter (HOSPITAL_COMMUNITY): Payer: Self-pay | Admitting: Emergency Medicine
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
## 2019-12-22 ENCOUNTER — Telehealth: Payer: Self-pay

## 2019-12-22 NOTE — Telephone Encounter (Signed)
Pt's girlfriend called in and stated pt is having green drainage from his incision, No fever or chills or redness around the incision per pts girlfriend. They would like to be worked in for this. I offered an appointment today but due to transportation they declined this appointment. I scheduled pt to come in tomorrow morning instead. They did state understanding to risks of waiting

## 2019-12-23 ENCOUNTER — Telehealth: Payer: Self-pay

## 2019-12-23 ENCOUNTER — Ambulatory Visit (INDEPENDENT_AMBULATORY_CARE_PROVIDER_SITE_OTHER): Payer: Medicaid Other | Admitting: Orthopedic Surgery

## 2019-12-23 DIAGNOSIS — S81012D Laceration without foreign body, left knee, subsequent encounter: Secondary | ICD-10-CM

## 2019-12-23 NOTE — Telephone Encounter (Signed)
Patient on Dr Alfonso Patten schedule this morning for post op concern.  Per Dr August Saucer, patient should follow up with Dr Carola Frost.  I tried calling every number in patients chart about his appointment this morning. No answer. Unable to LM VM full.

## 2019-12-24 ENCOUNTER — Encounter: Payer: Self-pay | Admitting: Orthopedic Surgery

## 2019-12-24 NOTE — Progress Notes (Signed)
Post-Op Visit Note   Patient: Hunter Bradshaw           Date of Birth: 03-Mar-1976           MRN: [id number] Visit Date: 12/23/2019 PCP: Juliette Alcide, MD   Assessment & Plan:  Chief Complaint:  Chief Complaint  Patient presents with   drainage from incision   Visit Diagnoses:  1. Knee laceration, left, subsequent encounter     Plan: Ron is here postop from right ankle open calcaneus fracture treatment as well as left knee laceration.  He is concerned about drainage from the left knee region.  On examination he has essentially healing open knee laceration with some sutures present.  The sutures are removed.  No active infection induration fluctuance or erythema.  On the right-hand side he has a wound VAC over an open calcaneal ankle fracture which is been treated.  The wound VAC is not functioning and is removed.  Some maceration around the incision but overall it looks intact.  Rena put a dry dressing on that and have him see Dr. Carola Frost within the next several days.  Follow-up with [place] as needed.  Weightbearing as tolerated left leg encouraged.  Follow-Up Instructions: Return if symptoms worsen or fail to improve.   Orders:  No orders of the defined types were placed in this encounter.  No orders of the defined types were placed in this encounter.   Imaging: No results found.  PMFS History: Patient Active Problem List   Diagnosis Date Noted   Open calcaneal fracture 12/01/2019   Past Medical History:  Diagnosis Date   Arthritis    Chronic back pain    GERD (gastroesophageal reflux disease)    Hypertension    Sciatica     History reviewed. No pertinent family history.  Past Surgical History:  Procedure Laterality Date   ANTERIOR CERVICAL DECOMP/DISCECTOMY FUSION     BACK SURGERY     4 prior back surgeries   EXTERNAL FIXATION LEG Right 12/01/2019   Procedure: EXTERNAL FIXATION LEG;  Surgeon: Cammy Copa, MD;  Location: [facility] OR;  Service:  Orthopedics;  Laterality: Right;   EXTERNAL FIXATION REMOVAL Right 12/03/2019   Procedure: REMOVAL EXTERNAL FIXATION LEG;  Surgeon: Myrene Galas, MD;  Location: [facility] OR;  Service: Orthopedics;  Laterality: Right;   I & D EXTREMITY Bilateral 12/01/2019   Procedure: IRRIGATION AND DEBRIDEMENT OF LEG;  Surgeon: Cammy Copa, MD;  Location: [facility] OR;  Service: Orthopedics;  Laterality: Bilateral;   I & D EXTREMITY Right 12/03/2019   Procedure: IRRIGATION AND DEBRIDEMENT OF open ankle fracture;  Surgeon: Myrene Galas, MD;  Location: [facility] OR;  Service: Orthopedics;  Laterality: Right;   KNEE SURGERY     bilateral ACL repair   NECK SURGERY     ORIF CALCANEOUS FRACTURE Right 12/03/2019   Procedure: OPEN REDUCTION INTERNAL FIXATION (ORIF) CALCANEOUS FRACTURE;  Surgeon: Myrene Galas, MD;  Location: [facility] OR;  Service: Orthopedics;  Laterality: Right;   SHOULDER ARTHROSCOPY     Left   Social History   Occupational History   Not on file  Tobacco Use   Smoking status: Current Every Day Smoker    Packs/day: 0.50    Years: 23.00    Pack years: 11.50  Substance and Sexual Activity   Alcohol use: Yes    Comment: liqour today   Drug use: Yes    Types: Marijuana    Comment: today   Sexual activity: Not on file
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
## 2020-05-07 ENCOUNTER — Encounter (HOSPITAL_COMMUNITY): Admission: EM | Payer: Self-pay | Attending: Family Medicine

## 2020-05-07 ENCOUNTER — Emergency Department (HOSPITAL_COMMUNITY): Payer: Medicaid Other

## 2020-05-07 ENCOUNTER — Inpatient Hospital Stay (HOSPITAL_COMMUNITY): Payer: Medicaid Other

## 2020-05-07 ENCOUNTER — Inpatient Hospital Stay (HOSPITAL_COMMUNITY): Payer: Medicaid Other | Admitting: Certified Registered Nurse Anesthetist

## 2020-05-07 ENCOUNTER — Encounter (HOSPITAL_COMMUNITY): Payer: Self-pay

## 2020-05-07 ENCOUNTER — Inpatient Hospital Stay (HOSPITAL_COMMUNITY)
Admission: EM | Admit: 2020-05-07 | Discharge: 2020-05-11 | DRG: 988 | Payer: Medicaid Other | Attending: Family Medicine | Admitting: Family Medicine

## 2020-05-07 DIAGNOSIS — R109 Unspecified abdominal pain: Secondary | ICD-10-CM | POA: Insufficient documentation

## 2020-05-07 DIAGNOSIS — E872 Acidosis: Secondary | ICD-10-CM | POA: Diagnosis present

## 2020-05-07 DIAGNOSIS — E8809 Other disorders of plasma-protein metabolism, not elsewhere classified: Secondary | ICD-10-CM | POA: Diagnosis present

## 2020-05-07 DIAGNOSIS — N201 Calculus of ureter: Secondary | ICD-10-CM | POA: Diagnosis not present

## 2020-05-07 DIAGNOSIS — Z87442 Personal history of urinary calculi: Secondary | ICD-10-CM

## 2020-05-07 DIAGNOSIS — M25571 Pain in right ankle and joints of right foot: Secondary | ICD-10-CM | POA: Diagnosis not present

## 2020-05-07 DIAGNOSIS — G8929 Other chronic pain: Secondary | ICD-10-CM | POA: Diagnosis present

## 2020-05-07 DIAGNOSIS — I700 Atherosclerosis of aorta: Secondary | ICD-10-CM | POA: Diagnosis present

## 2020-05-07 DIAGNOSIS — M1990 Unspecified osteoarthritis, unspecified site: Secondary | ICD-10-CM | POA: Diagnosis present

## 2020-05-07 DIAGNOSIS — K2970 Gastritis, unspecified, without bleeding: Secondary | ICD-10-CM | POA: Diagnosis not present

## 2020-05-07 DIAGNOSIS — Z981 Arthrodesis status: Secondary | ICD-10-CM | POA: Diagnosis not present

## 2020-05-07 DIAGNOSIS — E860 Dehydration: Secondary | ICD-10-CM | POA: Diagnosis present

## 2020-05-07 DIAGNOSIS — B1920 Unspecified viral hepatitis C without hepatic coma: Secondary | ICD-10-CM | POA: Diagnosis present

## 2020-05-07 DIAGNOSIS — Z20822 Contact with and (suspected) exposure to covid-19: Secondary | ICD-10-CM | POA: Diagnosis present

## 2020-05-07 DIAGNOSIS — Z800 Family history of malignant neoplasm of digestive organs: Secondary | ICD-10-CM | POA: Diagnosis not present

## 2020-05-07 DIAGNOSIS — F17210 Nicotine dependence, cigarettes, uncomplicated: Secondary | ICD-10-CM | POA: Diagnosis present

## 2020-05-07 DIAGNOSIS — L24A9 Irritant contact dermatitis due friction or contact with other specified body fluids: Secondary | ICD-10-CM | POA: Diagnosis not present

## 2020-05-07 DIAGNOSIS — K922 Gastrointestinal hemorrhage, unspecified: Secondary | ICD-10-CM | POA: Diagnosis present

## 2020-05-07 DIAGNOSIS — N132 Hydronephrosis with renal and ureteral calculous obstruction: Secondary | ICD-10-CM | POA: Diagnosis present

## 2020-05-07 DIAGNOSIS — L03115 Cellulitis of right lower limb: Secondary | ICD-10-CM | POA: Diagnosis present

## 2020-05-07 DIAGNOSIS — D62 Acute posthemorrhagic anemia: Secondary | ICD-10-CM | POA: Diagnosis present

## 2020-05-07 DIAGNOSIS — B9681 Helicobacter pylori [H. pylori] as the cause of diseases classified elsewhere: Secondary | ICD-10-CM | POA: Diagnosis present

## 2020-05-07 DIAGNOSIS — N400 Enlarged prostate without lower urinary tract symptoms: Secondary | ICD-10-CM | POA: Diagnosis present

## 2020-05-07 DIAGNOSIS — I10 Essential (primary) hypertension: Secondary | ICD-10-CM | POA: Diagnosis present

## 2020-05-07 DIAGNOSIS — X58XXXA Exposure to other specified factors, initial encounter: Secondary | ICD-10-CM | POA: Diagnosis present

## 2020-05-07 DIAGNOSIS — K921 Melena: Secondary | ICD-10-CM

## 2020-05-07 DIAGNOSIS — N35912 Unspecified bulbous urethral stricture, male: Secondary | ICD-10-CM | POA: Diagnosis present

## 2020-05-07 DIAGNOSIS — S91001A Unspecified open wound, right ankle, initial encounter: Secondary | ICD-10-CM | POA: Diagnosis present

## 2020-05-07 DIAGNOSIS — K449 Diaphragmatic hernia without obstruction or gangrene: Secondary | ICD-10-CM | POA: Diagnosis present

## 2020-05-07 DIAGNOSIS — R739 Hyperglycemia, unspecified: Secondary | ICD-10-CM | POA: Diagnosis present

## 2020-05-07 DIAGNOSIS — Z23 Encounter for immunization: Secondary | ICD-10-CM | POA: Diagnosis not present

## 2020-05-07 DIAGNOSIS — K264 Chronic or unspecified duodenal ulcer with hemorrhage: Principal | ICD-10-CM

## 2020-05-07 DIAGNOSIS — R55 Syncope and collapse: Secondary | ICD-10-CM | POA: Diagnosis present

## 2020-05-07 DIAGNOSIS — R1084 Generalized abdominal pain: Secondary | ICD-10-CM

## 2020-05-07 DIAGNOSIS — K219 Gastro-esophageal reflux disease without esophagitis: Secondary | ICD-10-CM | POA: Diagnosis present

## 2020-05-07 DIAGNOSIS — G2581 Restless legs syndrome: Secondary | ICD-10-CM | POA: Diagnosis present

## 2020-05-07 DIAGNOSIS — T148XXA Other injury of unspecified body region, initial encounter: Secondary | ICD-10-CM

## 2020-05-07 DIAGNOSIS — K7460 Unspecified cirrhosis of liver: Secondary | ICD-10-CM | POA: Diagnosis present

## 2020-05-07 DIAGNOSIS — Z6836 Body mass index (BMI) 36.0-36.9, adult: Secondary | ICD-10-CM | POA: Diagnosis not present

## 2020-05-07 DIAGNOSIS — D6959 Other secondary thrombocytopenia: Secondary | ICD-10-CM | POA: Diagnosis present

## 2020-05-07 HISTORY — PX: CYSTOSCOPY W/ URETERAL STENT PLACEMENT: SHX1429

## 2020-05-07 LAB — URINALYSIS, ROUTINE W REFLEX MICROSCOPIC
Bilirubin Urine: NEGATIVE
Glucose, UA: NEGATIVE mg/dL
Hgb urine dipstick: NEGATIVE
Ketones, ur: NEGATIVE mg/dL
Leukocytes,Ua: NEGATIVE
Nitrite: NEGATIVE
Protein, ur: NEGATIVE mg/dL
Specific Gravity, Urine: >1.046 — ABNORMAL HIGH (ref 1.005–1.030)
pH: 5 (ref 5.0–8.0)

## 2020-05-07 LAB — POC OCCULT BLOOD, ED: Fecal Occult Bld: POSITIVE — AB

## 2020-05-07 LAB — COMPREHENSIVE METABOLIC PANEL
ALT: 80 U/L — ABNORMAL HIGH (ref 0–44)
AST: 48 U/L — ABNORMAL HIGH (ref 15–41)
Albumin: 2.8 g/dL — ABNORMAL LOW (ref 3.5–5.0)
Alkaline Phosphatase: 80 U/L (ref 38–126)
Anion gap: 10 (ref 5–15)
BUN: 38 mg/dL — ABNORMAL HIGH (ref 6–20)
CO2: 20 mmol/L — ABNORMAL LOW (ref 22–32)
Calcium: 8.6 mg/dL — ABNORMAL LOW (ref 8.9–10.3)
Chloride: 106 mmol/L (ref 98–111)
Creatinine, Ser: 0.92 mg/dL (ref 0.61–1.24)
GFR, Estimated: >60 mL/min (ref >60)
Glucose, Bld: 159 mg/dL — ABNORMAL HIGH (ref 70–99)
Potassium: 4 mmol/L (ref 3.5–5.1)
Sodium: 136 mmol/L (ref 135–145)
Total Bilirubin: 0.1 mg/dL — ABNORMAL LOW (ref 0.3–1.2)
Total Protein: 6.7 g/dL (ref 6.5–8.1)

## 2020-05-07 LAB — PROTIME-INR
INR: 1.2 (ref 0.8–1.2)
Prothrombin Time: 14.7 seconds (ref 11.4–15.2)

## 2020-05-07 LAB — CBC
HCT: 30.8 % — ABNORMAL LOW (ref 39.0–52.0)
Hemoglobin: 9.5 g/dL — ABNORMAL LOW (ref 13.0–17.0)
MCH: 29.9 pg (ref 26.0–34.0)
MCHC: 30.8 g/dL (ref 30.0–36.0)
MCV: 96.9 fL (ref 80.0–100.0)
Platelets: 250 10*3/uL (ref 150–400)
RBC: 3.18 MIL/uL — ABNORMAL LOW (ref 4.22–5.81)
RDW: 15.8 % — ABNORMAL HIGH (ref 11.5–15.5)
WBC: 19 10*3/uL — ABNORMAL HIGH (ref 4.0–10.5)
nRBC: 0 % (ref 0.0–0.2)

## 2020-05-07 LAB — I-STAT CHEM 8, ED
BUN: 38 mg/dL — ABNORMAL HIGH (ref 6–20)
Calcium, Ion: 1.22 mmol/L (ref 1.15–1.40)
Chloride: 104 mmol/L (ref 98–111)
Creatinine, Ser: 0.7 mg/dL (ref 0.61–1.24)
Glucose, Bld: 150 mg/dL — ABNORMAL HIGH (ref 70–99)
HCT: 29 % — ABNORMAL LOW (ref 39.0–52.0)
Hemoglobin: 9.9 g/dL — ABNORMAL LOW (ref 13.0–17.0)
Potassium: 4.1 mmol/L (ref 3.5–5.1)
Sodium: 139 mmol/L (ref 135–145)
TCO2: 21 mmol/L — ABNORMAL LOW (ref 22–32)

## 2020-05-07 LAB — CBC WITH DIFFERENTIAL/PLATELET
Abs Immature Granulocytes: 0.09 10*3/uL — ABNORMAL HIGH (ref 0.00–0.07)
Basophils Absolute: 0.1 10*3/uL (ref 0.0–0.1)
Basophils Relative: 0 %
Eosinophils Absolute: 0.1 10*3/uL (ref 0.0–0.5)
Eosinophils Relative: 0 %
HCT: 26.4 % — ABNORMAL LOW (ref 39.0–52.0)
Hemoglobin: 8.8 g/dL — ABNORMAL LOW (ref 13.0–17.0)
Immature Granulocytes: 1 %
Lymphocytes Relative: 41 %
Lymphs Abs: 7.9 10*3/uL — ABNORMAL HIGH (ref 0.7–4.0)
MCH: 30.6 pg (ref 26.0–34.0)
MCHC: 33.3 g/dL (ref 30.0–36.0)
MCV: 91.7 fL (ref 80.0–100.0)
Monocytes Absolute: 1.2 10*3/uL — ABNORMAL HIGH (ref 0.1–1.0)
Monocytes Relative: 6 %
Neutro Abs: 9.8 10*3/uL — ABNORMAL HIGH (ref 1.7–7.7)
Neutrophils Relative %: 52 %
Platelets: 148 10*3/uL — ABNORMAL LOW (ref 150–400)
RBC: 2.88 MIL/uL — ABNORMAL LOW (ref 4.22–5.81)
RDW: 16.2 % — ABNORMAL HIGH (ref 11.5–15.5)
Smear Review: ADEQUATE
WBC: 19.1 10*3/uL — ABNORMAL HIGH (ref 4.0–10.5)
nRBC: 0 % (ref 0.0–0.2)

## 2020-05-07 LAB — LACTIC ACID, PLASMA
Lactic Acid, Venous: 3.1 mmol/L (ref 0.5–1.9)
Lactic Acid, Venous: 2.6 mmol/L (ref 0.5–1.9)

## 2020-05-07 LAB — RESP PANEL BY RT-PCR (FLU A&B, COVID) ARPGX2
Influenza A by PCR: NEGATIVE
Influenza B by PCR: NEGATIVE
SARS Coronavirus 2 by RT PCR: NEGATIVE

## 2020-05-07 SURGERY — CYSTOSCOPY, WITH RETROGRADE PYELOGRAM AND URETERAL STENT INSERTION
Anesthesia: General | Site: Ureter | Laterality: Right

## 2020-05-07 MED ORDER — SODIUM CHLORIDE 0.9 % IV SOLN: INTRAVENOUS | Status: DC

## 2020-05-07 MED ORDER — SODIUM CHLORIDE 0.9 % IV BOLUS
1000.0000 mL | Freq: Once | INTRAVENOUS | Status: AC
  Administered 2020-05-07: 1000 mL via INTRAVENOUS

## 2020-05-07 MED ORDER — NICOTINE 14 MG/24HR TD PT24
14.0000 mg | MEDICATED_PATCH | Freq: Every day | TRANSDERMAL | Status: DC
  Administered 2020-05-08 – 2020-05-11 (×4): 14 mg via TRANSDERMAL
  Filled 2020-05-07 (×4): qty 1

## 2020-05-07 MED ORDER — SUCCINYLCHOLINE CHLORIDE 20 MG/ML IJ SOLN
INTRAMUSCULAR | Status: DC | PRN
  Administered 2020-05-07: 160 mg via INTRAVENOUS

## 2020-05-07 MED ORDER — IOHEXOL 300 MG/ML  SOLN
INTRAMUSCULAR | Status: DC | PRN
  Administered 2020-05-07: 18:00:00 4 mL via URETHRAL

## 2020-05-07 MED ORDER — MORPHINE SULFATE (PF) 2 MG/ML IV SOLN
2.0000 mg | INTRAVENOUS | Status: DC | PRN
  Administered 2020-05-07 – 2020-05-08 (×4): 2 mg via INTRAVENOUS
  Filled 2020-05-07 (×4): qty 1

## 2020-05-07 MED ORDER — ONDANSETRON HCL 4 MG/2ML IJ SOLN
INTRAMUSCULAR | Status: DC | PRN
  Administered 2020-05-07: 4 mg via INTRAVENOUS

## 2020-05-07 MED ORDER — SODIUM CHLORIDE 0.9 % IR SOLN
Status: DC | PRN
  Administered 2020-05-07: 3000 mL

## 2020-05-07 MED ORDER — PROPOFOL 10 MG/ML IV BOLUS
INTRAVENOUS | Status: AC
  Filled 2020-05-07: qty 20

## 2020-05-07 MED ORDER — SODIUM CHLORIDE 0.9 % IV SOLN
80.0000 mg | Freq: Once | INTRAVENOUS | Status: AC
  Administered 2020-05-07: 80 mg via INTRAVENOUS
  Filled 2020-05-07: qty 80

## 2020-05-07 MED ORDER — GENTAMICIN SULFATE 40 MG/ML IJ SOLN
5.0000 mg/kg | Freq: Once | INTRAVENOUS | Status: AC
  Administered 2020-05-07: 18:00:00 434.4 mg via INTRAVENOUS
  Filled 2020-05-07: qty 10.75

## 2020-05-07 MED ORDER — FENTANYL CITRATE (PF) 100 MCG/2ML IJ SOLN
INTRAMUSCULAR | Status: DC | PRN
  Administered 2020-05-07 (×5): 50 ug via INTRAVENOUS

## 2020-05-07 MED ORDER — LIDOCAINE 2% (20 MG/ML) 5 ML SYRINGE
INTRAMUSCULAR | Status: DC | PRN
  Administered 2020-05-07: 100 mg via INTRAVENOUS

## 2020-05-07 MED ORDER — MIDAZOLAM HCL 2 MG/2ML IJ SOLN
INTRAMUSCULAR | Status: AC
  Filled 2020-05-07: qty 2

## 2020-05-07 MED ORDER — SODIUM CHLORIDE 0.9 % IV SOLN
8.0000 mg/h | INTRAVENOUS | Status: DC
  Administered 2020-05-07: 8 mg/h via INTRAVENOUS
  Filled 2020-05-07 (×2): qty 80

## 2020-05-07 MED ORDER — FENTANYL CITRATE (PF) 250 MCG/5ML IJ SOLN
INTRAMUSCULAR | Status: AC
  Filled 2020-05-07: qty 5

## 2020-05-07 MED ORDER — PROPOFOL 10 MG/ML IV BOLUS
INTRAVENOUS | Status: DC | PRN
  Administered 2020-05-07: 130 mg via INTRAVENOUS

## 2020-05-07 MED ORDER — PHENYLEPHRINE HCL (PRESSORS) 10 MG/ML IV SOLN
INTRAVENOUS | Status: DC | PRN
  Administered 2020-05-07: 40 ug via INTRAVENOUS
  Administered 2020-05-07: 80 ug via INTRAVENOUS

## 2020-05-07 MED ORDER — LACTATED RINGERS IV SOLN: INTRAVENOUS | Status: DC | PRN

## 2020-05-07 MED ORDER — IOHEXOL 350 MG/ML SOLN
100.0000 mL | Freq: Once | INTRAVENOUS | Status: AC | PRN
  Administered 2020-05-07: 100 mL via INTRAVENOUS

## 2020-05-07 MED ORDER — MELATONIN 5 MG PO TABS
5.0000 mg | ORAL_TABLET | Freq: Every day | ORAL | Status: DC
  Administered 2020-05-07 – 2020-05-10 (×4): 5 mg via ORAL
  Filled 2020-05-07 (×4): qty 1

## 2020-05-07 MED ORDER — MIDAZOLAM HCL 5 MG/5ML IJ SOLN
INTRAMUSCULAR | Status: DC | PRN
  Administered 2020-05-07: 2 mg via INTRAVENOUS

## 2020-05-07 MED ORDER — PANTOPRAZOLE SODIUM 40 MG IV SOLR: 40.0000 mg | Freq: Two times a day (BID) | INTRAVENOUS | Status: DC

## 2020-05-07 MED ORDER — VANCOMYCIN HCL 1500 MG/300ML IV SOLN
1500.0000 mg | Freq: Two times a day (BID) | INTRAVENOUS | Status: DC
  Administered 2020-05-08: 12:00:00 1500 mg via INTRAVENOUS
  Filled 2020-05-07 (×3): qty 300

## 2020-05-07 MED ORDER — VANCOMYCIN HCL 2000 MG/400ML IV SOLN
2000.0000 mg | Freq: Once | INTRAVENOUS | Status: AC
  Administered 2020-05-07: 22:00:00 2000 mg via INTRAVENOUS
  Filled 2020-05-07 (×2): qty 400

## 2020-05-07 SURGICAL SUPPLY — 17 items
BAG URO CATCHER STRL LF (MISCELLANEOUS) ×2 IMPLANT
CATH FOLEY 2WAY 5CC 16FR (CATHETERS)
CATH URET 5FR 28IN OPEN ENDED (CATHETERS) ×2 IMPLANT
CATH URTH STD 16FR FL 2W DRN (CATHETERS) IMPLANT
GLOVE BIOGEL M 7.0 STRL (GLOVE) ×2 IMPLANT
GOWN STRL REUS W/ TWL LRG LVL3 (GOWN DISPOSABLE) ×2 IMPLANT
GOWN STRL REUS W/TWL LRG LVL3 (GOWN DISPOSABLE) ×4
GUIDEWIRE ANG ZIPWIRE 038X150 (WIRE) IMPLANT
GUIDEWIRE STR DUAL SENSOR (WIRE) ×2 IMPLANT
IV NS 1000ML (IV SOLUTION) ×2
IV NS 1000ML BAXH (IV SOLUTION) ×1 IMPLANT
KIT TURNOVER KIT A (KITS) ×2 IMPLANT
MANIFOLD NEPTUNE II (INSTRUMENTS) ×2 IMPLANT
PACK CYSTO (CUSTOM PROCEDURE TRAY) ×2 IMPLANT
STENT CONTOUR 6FRX26X.035 (STENTS) ×2 IMPLANT
STENT CONTOUR 6FRX26X.038 (STENTS) ×2 IMPLANT
TUBE CONNECTING 12X1/4 (SUCTIONS) ×2 IMPLANT

## 2020-05-07 NOTE — H&P (View-Only) (Signed)
Urology Consult   Physician requesting consult: Quintella Reichert, MD  Reason for consult: Right distal ureteral stone  History of Present Illness: Hunter Bradshaw is a 44 y.o. who presented with right sided abdominal pain, GI bleed, right flank and lower back pain. He also has a right foot infection for which he has been taking doxycycline. He states he has had right sided flank pain for the past 2 weeks. He states he has had fevers 1-2 weeks ago, but nothing recent. He still reports right flank pian. He does have a history of urolithiasis and has passed several stones. He has never required procedures for ureteral stones. He last ate at [facility]. He is COVID negative.  Past Medical History:  Diagnosis Date  . Arthritis   . Chronic back pain   . GERD (gastroesophageal reflux disease)   . Hypertension   . Sciatica     Past Surgical History:  Procedure Laterality Date  . ANTERIOR CERVICAL DECOMP/DISCECTOMY FUSION    . BACK SURGERY     4 prior back surgeries  . EXTERNAL FIXATION LEG Right 12/01/2019   Procedure: EXTERNAL FIXATION LEG;  Surgeon: Meredith Pel, MD;  Location: [place];  Service: Orthopedics;  Laterality: Right;  . EXTERNAL FIXATION REMOVAL Right 12/03/2019   Procedure: REMOVAL EXTERNAL FIXATION LEG;  Surgeon: Altamese [place], MD;  Location: [place];  Service: Orthopedics;  Laterality: Right;  . I & D EXTREMITY Bilateral 12/01/2019   Procedure: IRRIGATION AND DEBRIDEMENT OF LEG;  Surgeon: Meredith Pel, MD;  Location: [place];  Service: Orthopedics;  Laterality: Bilateral;  . I & D EXTREMITY Right 12/03/2019   Procedure: IRRIGATION AND DEBRIDEMENT OF open ankle fracture;  Surgeon: Altamese Mission, MD;  Location: [place];  Service: Orthopedics;  Laterality: Right;  . KNEE SURGERY     bilateral ACL repair  . NECK SURGERY    . ORIF CALCANEOUS FRACTURE Right 12/03/2019   Procedure: OPEN REDUCTION INTERNAL FIXATION (ORIF) CALCANEOUS FRACTURE;  Surgeon: Altamese [place], MD;  Location: [place];  Service: Orthopedics;  Laterality: Right;  . SHOULDER ARTHROSCOPY     Left    Current Hospital Medications:  Home Meds:  No current facility-administered medications on file prior to encounter.   Current Outpatient Medications on File Prior to Encounter  Medication Sig Dispense Refill  . acetaminophen (TYLENOL) 500 MG tablet Take 500 mg by mouth every 6 (six) hours as needed for moderate pain.    . cyclobenzaprine (FLEXERIL) 5 MG tablet Take 1 tablet (5 mg total) by mouth 3 (three) times daily as needed. (Patient not taking: No sig reported) 30 tablet 0  . naproxen (NAPROSYN) 500 MG tablet Take 1 tablet (500 mg total) by mouth 2 (two) times daily with a meal. (Patient not taking: No sig reported) 30 tablet 0  . oxyCODONE-acetaminophen (PERCOCET/ROXICET) 5-325 MG per tablet Take 1 tablet by mouth every 4 (four) hours as needed. (Patient not taking: No sig reported) 24 tablet 0     Scheduled Meds: . [START ON 05/11/2020] pantoprazole  40 mg Intravenous Q12H   Continuous Infusions: . pantoprozole (PROTONIX) infusion 8 mg/hr (05/07/20 1353)   PRN Meds:.  Allergies: No Known Allergies  No family history on file.  Social History:  reports that he has been smoking. He has a 11.50 pack-year smoking history. He does not have any smokeless tobacco history on file. He reports current alcohol use. He reports current drug use. Drug: Marijuana.  ROS: A complete review of systems was  performed.  All systems are negative except for pertinent findings as noted.  Physical Exam:  Vital signs in last 24 hours: Temp:  [98.2 F (36.8 C)] 98.2 F (36.8 C) (12/26 1111) Pulse Rate:  [94-120] 104 (12/26 1500) Resp:  [18-22] 22 (12/26 1500) BP: (100-112)/(55-82) 110/82 (12/26 1500) SpO2:  [99 %-100 %] 100 % (12/26 1500) Constitutional:  Alert and oriented, No acute distress Cardiovascular: Regular rate and rhythm Respiratory: Normal respiratory effort, Lungs clear bilaterally GI: Abdomen is  soft, ND, tender on right side GU: No CVA tenderness Neurologic: Grossly intact, no focal deficits Psychiatric: Normal mood and affect  Laboratory Data:  Recent Labs    05/07/20 1146 05/07/20 1154  WBC 19.0*  --   HGB 9.5* 9.9*  HCT 30.8* 29.0*  PLT 250  --     Recent Labs    05/07/20 1146 05/07/20 1154  NA 136 139  K 4.0 4.1  CL 106 104  GLUCOSE 159* 150*  BUN 38* 38*  CALCIUM 8.6*  --   CREATININE 0.92 0.70     Results for orders placed or performed during the hospital encounter of 05/07/20 (from the past 24 hour(s))  Comprehensive metabolic panel     Status: Abnormal   Collection Time: 05/07/20 11:46 AM  Result Value Ref Range   Sodium 136 135 - 145 mmol/L   Potassium 4.0 3.5 - 5.1 mmol/L   Chloride 106 98 - 111 mmol/L   CO2 20 (L) 22 - 32 mmol/L   Glucose, Bld 159 (H) 70 - 99 mg/dL   BUN 38 (H) 6 - 20 mg/dL   Creatinine, Ser 0.92 0.61 - 1.24 mg/dL   Calcium 8.6 (L) 8.9 - 10.3 mg/dL   Total Protein 6.7 6.5 - 8.1 g/dL   Albumin 2.8 (L) 3.5 - 5.0 g/dL   AST 48 (H) 15 - 41 U/L   ALT 80 (H) 0 - 44 U/L   Alkaline Phosphatase 80 38 - 126 U/L   Total Bilirubin 0.1 (L) 0.3 - 1.2 mg/dL   GFR, Estimated >60 >60 mL/min   Anion gap 10 5 - 15  CBC     Status: Abnormal   Collection Time: 05/07/20 11:46 AM  Result Value Ref Range   WBC 19.0 (H) 4.0 - 10.5 K/uL   RBC 3.18 (L) 4.22 - 5.81 MIL/uL   Hemoglobin 9.5 (L) 13.0 - 17.0 g/dL   HCT 30.8 (L) 39.0 - 52.0 %   MCV 96.9 80.0 - 100.0 fL   MCH 29.9 26.0 - 34.0 pg   MCHC 30.8 30.0 - 36.0 g/dL   RDW 15.8 (H) 11.5 - 15.5 %   Platelets 250 150 - 400 K/uL   nRBC 0.0 0.0 - 0.2 %  Type and screen [place]     Status: None   Collection Time: 05/07/20 11:46 AM  Result Value Ref Range   ABO/RH(D) A POS    Antibody Screen NEG    Sample Expiration      05/10/2020,2359 Performed at [facility], [address], [place], [place] [address]   Protime-INR - (order if Patient is taking Coumadin /  Warfarin)     Status: None   Collection Time: 05/07/20 11:46 AM  Result Value Ref Range   Prothrombin Time 14.7 11.4 - 15.2 seconds   INR 1.2 0.8 - 1.2  Lactic acid, plasma     Status: Abnormal   Collection Time: 05/07/20 11:46 AM  Result Value Ref Range  Lactic Acid, Venous 3.1 (HH) 0.5 - 1.9 mmol/L  I-stat chem 8, ED (not at [facility] or [facility])     Status: Abnormal   Collection Time: 05/07/20 11:54 AM  Result Value Ref Range   Sodium 139 135 - 145 mmol/L   Potassium 4.1 3.5 - 5.1 mmol/L   Chloride 104 98 - 111 mmol/L   BUN 38 (H) 6 - 20 mg/dL   Creatinine, Ser 0.70 0.61 - 1.24 mg/dL   Glucose, Bld 150 (H) 70 - 99 mg/dL   Calcium, Ion 1.22 1.15 - 1.40 mmol/L   TCO2 21 (L) 22 - 32 mmol/L   Hemoglobin 9.9 (L) 13.0 - 17.0 g/dL   HCT 29.0 (L) 39.0 - 52.0 %  POC occult blood, ED     Status: Abnormal   Collection Time: 05/07/20 12:07 PM  Result Value Ref Range   Fecal Occult Bld POSITIVE (A) NEGATIVE  Resp Panel by RT-PCR (Flu A&B, Covid) Nasopharyngeal Swab     Status: None   Collection Time: 05/07/20 12:56 PM   Specimen: Nasopharyngeal Swab; Nasopharyngeal(NP) swabs in vial transport medium  Result Value Ref Range   SARS Coronavirus 2 by RT PCR NEGATIVE NEGATIVE   Influenza A by PCR NEGATIVE NEGATIVE   Influenza B by PCR NEGATIVE NEGATIVE  Lactic acid, plasma     Status: Abnormal   Collection Time: 05/07/20  2:09 PM  Result Value Ref Range   Lactic Acid, Venous 2.6 (HH) 0.5 - 1.9 mmol/L  Urinalysis, Routine w reflex microscopic Urine, Catheterized     Status: Abnormal   Collection Time: 05/07/20  2:38 PM  Result Value Ref Range   Color, Urine YELLOW YELLOW   APPearance CLEAR CLEAR   Specific Gravity, Urine >1.046 (H) 1.005 - 1.030   pH 5.0 5.0 - 8.0   Glucose, UA NEGATIVE NEGATIVE mg/dL   Hgb urine dipstick NEGATIVE NEGATIVE   Bilirubin Urine NEGATIVE NEGATIVE   Ketones, ur NEGATIVE NEGATIVE mg/dL   Protein, ur NEGATIVE NEGATIVE mg/dL   Nitrite NEGATIVE NEGATIVE    Leukocytes,Ua NEGATIVE NEGATIVE   Recent Results (from the past 240 hour(s))  Resp Panel by RT-PCR (Flu A&B, Covid) Nasopharyngeal Swab     Status: None   Collection Time: 05/07/20 12:56 PM   Specimen: Nasopharyngeal Swab; Nasopharyngeal(NP) swabs in vial transport medium  Result Value Ref Range Status   SARS Coronavirus 2 by RT PCR NEGATIVE NEGATIVE Final    Comment: (NOTE) SARS-CoV-2 target nucleic acids are NOT DETECTED.  The SARS-CoV-2 RNA is generally detectable in upper respiratory specimens during the acute phase of infection. The lowest concentration of SARS-CoV-2 viral copies this assay can detect is 138 copies/mL. A negative result does not preclude SARS-Cov-2 infection and should not be used as the sole basis for treatment or other patient management decisions. A negative result may occur with  improper specimen collection/handling, submission of specimen other than nasopharyngeal swab, presence of viral mutation(s) within the areas targeted by this assay, and inadequate number of viral copies(<138 copies/mL). A negative result must be combined with clinical observations, patient history, and epidemiological information. The expected result is Negative.  Fact Sheet for Patients:  EntrepreneurPulse.com.au  Fact Sheet for Healthcare Providers:  IncredibleEmployment.be  This test is no t yet approved or cleared by the [place] FDA and  has been authorized for detection and/or diagnosis of SARS-CoV-2 by FDA under an Emergency Use Authorization (EUA). This EUA will remain  in effect (meaning this test can be used) for the  duration of the COVID-19 declaration under Section 564(b)(1) of the Act, 21 U.S.C.section 360bbb-3(b)(1), unless the authorization is terminated  or revoked sooner.       Influenza A by PCR NEGATIVE NEGATIVE Final   Influenza B by PCR NEGATIVE NEGATIVE Final    Comment: (NOTE) The Xpert Xpress  SARS-CoV-2/FLU/RSV plus assay is intended as an aid in the diagnosis of influenza from Nasopharyngeal swab specimens and should not be used as a sole basis for treatment. Nasal washings and aspirates are unacceptable for Xpert Xpress SARS-CoV-2/FLU/RSV testing.  Fact Sheet for Patients: EntrepreneurPulse.com.au  Fact Sheet for Healthcare Providers: IncredibleEmployment.be  This test is not yet approved or cleared by the [place] FDA and has been authorized for detection and/or diagnosis of SARS-CoV-2 by FDA under an Emergency Use Authorization (EUA). This EUA will remain in effect (meaning this test can be used) for the duration of the COVID-19 declaration under Section 564(b)(1) of the Act, 21 U.S.C. section 360bbb-3(b)(1), unless the authorization is terminated or revoked.  Performed at [facility], [place] [address], [place], [place] [address]     Renal Function: Recent Labs    05/07/20 1146 05/07/20 1154  CREATININE 0.92 0.70   CrCl cannot be calculated (Unknown ideal weight.).  Radiologic Imaging: DG Chest Port 1 View  Result Date: 05/07/2020 CLINICAL DATA:  Shortness of breath, lateral RIGHT ankle wound with drainage EXAM: PORTABLE CHEST 1 VIEW COMPARISON:  Portable exam 7846 hours compared to 12/05/2019 FINDINGS: Normal heart size, mediastinal contours, and pulmonary vascularity. Lungs clear. No infiltrate, pleural effusion, or pneumothorax. Bones demineralized. IMPRESSION: No acute abnormalities. Electronically Signed   By: Lavonia Dana M.D.   On: 05/07/2020 12:52   DG Ankle Right Port  Result Date: 05/07/2020 CLINICAL DATA:  Ankle wound with drainage, initial encounter EXAM: PORTABLE RIGHT ANKLE - 2 VIEW COMPARISON:  12/03/2019 FINDINGS: Multiple fixation screws are noted traversing the calcaneus and medial malleolus. Significant soft tissue swelling is noted. Previously seen calcaneal fracture is again identified and  stable. Loss of the plantar arch is noted as well. No new focal abnormality is seen. IMPRESSION: Prior calcaneal fracture with fixation. Generalized soft tissue swelling is seen. No other focal abnormality is noted. Electronically Signed   By: Inez Catalina M.D.   On: 05/07/2020 12:59   CT Angio Abd/Pel W and/or Wo Contrast  Result Date: 05/07/2020 CLINICAL DATA:  44 year old with rectal bleeding. EXAM: CTA ABDOMEN AND PELVIS WITHOUT AND WITH CONTRAST TECHNIQUE: Multidetector CT imaging of the abdomen and pelvis was performed using the standard protocol during bolus administration of intravenous contrast. Multiplanar reconstructed images and MIPs were obtained and reviewed to evaluate the vascular anatomy. CONTRAST:  182m OMNIPAQUE IOHEXOL 350 MG/ML SOLN COMPARISON:  CT chest, abdomen and pelvis 11/01/2019 FINDINGS: VASCULAR Aorta: Mild atherosclerotic disease in the distal abdominal aorta without aneurysm or dissection. No aortic stenosis. Celiac: Patent without evidence of aneurysm, dissection, vasculitis or significant stenosis. SMA: Patent without evidence of aneurysm, dissection, vasculitis or significant stenosis. Replaced right hepatic artery. Renals: Left renal artery is widely patent without aneurysm or dissection. Spindle amount of atherosclerotic disease involving the main right renal artery without significant stenosis. Viles accessory right renal artery. IMA: Patent Inflow: Mild atherosclerotic disease involving the iliac arteries. Common and external iliac arteries are patent bilaterally without significant stenosis. Left internal iliac artery is patent. Right internal iliac artery is patent but there may be atherosclerotic disease involving branch vessels. Proximal Outflow: Proximal femoral arteries are patent bilaterally. Veins:  Portal venous system is patent. Normal appearance of the IVC and renal veins. Iliac veins are unremarkable. Review of the MIP images confirms the above findings.  NON-VASCULAR Lower chest: Lung bases are clear. Hepatobiliary: Liver has a nodular contour, particularly along the inferior right hepatic lobe. Findings are concerning for cirrhosis. No acute abnormality to the gallbladder. No biliary dilatation. Pancreas: Unremarkable. No pancreatic ductal dilatation or surrounding inflammatory changes. Spleen: Normal in size without focal abnormality. Adrenals/Urinary Tract: Normal appearance of both adrenal glands. Normal appearance of the left kidney without stones, renal lesion or hydronephrosis. Mild dilatation of the right renal pelvis and right ureter with a new 3 mm stone in the distal right ureter just proximal to the right ureterovesical junction. In addition, there is a 2 mm stone in the mid right kidney. Urinary bladder is unremarkable. Stomach/Bowel: Stomach is within normal limits. Appendix appears normal. No evidence of bowel wall thickening, distention, or inflammatory changes. No evidence for active GI bleeding. Lymphatic: Youtsey lymph nodes in the gastrohepatic ligament. Precaval lymph node measures 1.4 cm in short axis and stable. Right external iliac lymph node measures 7 mm in short axis on sequence 10, image 161 and minimally changed from the prior examination. Koral lymph nodes along the anterior pelvic sidewalls and inguinal regions are unchanged from the prior examination. Reproductive: Prostate is unremarkable. Other: Negative for free fluid. Negative for free air. Ausburn umbilical hernia containing fat. Musculoskeletal: Old fractures involving the left transverse process at L1, L2 and L3. Again noted is pedicle screw and bilateral rod fixation at L4, L5 and S1 with interbody devices. Old fractures of the right tenth, ninth, eighth and seventh ribs. IMPRESSION: VASCULAR 1. No evidence for active GI bleeding. 2. Mild atherosclerotic disease in the aorta and iliac arteries without significant stenosis. Main visceral arteries are patent. NON-VASCULAR 1. Mild  right hydroureteronephrosis secondary to a 3 mm stone in the distal right ureter. Additional tiny right kidney stone. 2. Old fractures involving right ribs and lumbar left transverse processes. Stable appearance of the surgical fusion at L4 through S1. 3. Liver has a nodular contour and concerning for cirrhosis. Electronically Signed   By: Markus Daft M.D.   On: 05/07/2020 14:16    I independently reviewed the above imaging studies.  Impression/Recommendation 1. Right distal ureteral stone: Measures 25m on CTA A/P 12/26 with mild right hydro. Also present tiny right renal stone. Afebrile in ED, WBC 19, Cr 0.7, UA without sign of infection, UCx pending. 2.  Upper GI bleed 3. Right foot infection  -Discussed options including observing with MET, vs stenting vs treatment of ureteral stone if distal enough and will be able to be [facility] -UA without sign of UTI. -Pt with persistent pain for 2 weeks and leukocytosis that may be due to obstructing ureteral stone, UGI bleed or right foot infection -Will proceed to OR for cysto, right stent placement vs right ureteroscopy and basket extraction of stone if stone seen at [facility].  -The risks, benefits and alternatives of cystoscopy with right JJ stent placement was discussed with the patient.  Risks include, but are not limited to: bleeding, urinary tract infection, ureteral injury, ureteral stricture disease, chronic pain, urinary symptoms, bladder injury, stent migration, the need for nephrostomy tube placement, MI, CVA, DVT, PE and the inherent risks with general anesthesia.  The patient voices understanding and wishes to proceed.   Matt R. Tashai Catino MD 05/07/2020, 4:17 PM  [facility]  Pager:  [phone]   CC: Quintella Reichert, MD

## 2020-05-07 NOTE — H&P (View-Only) (Signed)
Consultation  Referring Provider:     Tilden Fossa Primary Care Physician:  Juliette Alcide, MD Primary Gastroenterologist:        Gentry Fitz Reason for Consultation:     GI bleeding         HPI:   Hunter Bradshaw is a 44 y.o. male with a medical history of GERD, hypertension, back pain, currently incarcerated, presenting to the hospital with melena, abdominal pain, syncope.  He states over the past 3 days he has not been feeling well.  He has been having 1 black bowel movement per day over the past 3 days.  He has been feeling lightheadedness and dizziness and had a [facility] the jail.  He has been having some low to mid abdominal pain which has been ongoing since this time as well.  Pain not related to bowel movements it seems to be persistent.  He states it is "killing him right now".  He denies NSAID use.  He is never had an EGD or colonoscopy before.  He denies any history of GI bleeding.  He has an infection in his right ankle/foot which has drained pus in recent days.  He has been on doxycycline for that, thinks it is getting better.  Has not had any purulent drainage in a few days from that.  Upon arrival in the ED his hemoglobin was 9.5 with a white blood cell count of 19.0.  Baseline hemoglobin nines to tens back in July the last time he was admitted.  Lactic acid of 3.1.  BUN of 38.,  Creatinine normal. HR around 100, BP 110s/90s.  He is feeling better since he has had some fluids in the ED.  Covid test pending    Past Medical History:  Diagnosis Date  . Arthritis   . Chronic back pain   . GERD (gastroesophageal reflux disease)   . Hypertension   . Sciatica     Past Surgical History:  Procedure Laterality Date  . ANTERIOR CERVICAL DECOMP/DISCECTOMY FUSION    . BACK SURGERY     4 prior back surgeries  . EXTERNAL FIXATION LEG Right 12/01/2019   Procedure: EXTERNAL FIXATION LEG;  Surgeon: Cammy Copa, MD;  Location[facility];  Service: Orthopedics;   Laterality: Right;  . EXTERNAL FIXATION REMOVAL Right 12/03/2019   Procedure: REMOVAL EXTERNAL FIXATION LEG;  Surgeon: Myrene Galas, MD;  Location: [facility] OR;  Service: Orthopedics;  Laterality: Right;  . I & D EXTREMITY Bilateral 12/01/2019   Procedure: IRRIGATION AND DEBRIDEMENT OF LEG;  Surgeon: Cammy Copa, MD;  Location: [facility] OR;  Service: Orthopedics;  Laterality: Bilateral;  . I & D EXTREMITY Right 12/03/2019   Procedure: IRRIGATION AND DEBRIDEMENT OF open ankle fracture;  Surgeon: Myrene Galas, MD;  Location: [facility] OR;  Service: Orthopedics;  Laterality: Right;  . KNEE SURGERY     bilateral ACL repair  . NECK SURGERY    . ORIF CALCANEOUS FRACTURE Right 12/03/2019   Procedure: OPEN REDUCTION INTERNAL FIXATION (ORIF) CALCANEOUS FRACTURE;  Surgeon: Myrene Galas, MD;  Location: [facility] OR;  Service: Orthopedics;  Laterality: Right;  . SHOULDER ARTHROSCOPY     Left    No family history on file.  Patient denies any family history of malignancy of the GI tract  Social History   Tobacco Use  . Smoking status: Current Every Day Smoker    Packs/day: 0.50    Years: 23.00    Pack years: 11.50  Substance Use Topics  .  Alcohol use: Yes    Comment: liqour today  . Drug use: Yes    Types: Marijuana    Comment: today    Prior to Admission medications   Medication Sig Start Date End Date Taking? Authorizing Provider  acetaminophen (TYLENOL) 500 MG tablet Take 500 mg by mouth every 6 (six) hours as needed for moderate pain.   Yes [provider]  cyclobenzaprine (FLEXERIL) 5 MG tablet Take 1 tablet (5 mg total) by mouth 3 (three) times daily as needed. Patient not taking: No sig reported 09/08/13   Devoria Albe, MD  naproxen (NAPROSYN) 500 MG tablet Take 1 tablet (500 mg total) by mouth 2 (two) times daily with a meal. Patient not taking: No sig reported 03/28/14   Triplett, Tammy, PA-C  oxyCODONE-acetaminophen (PERCOCET/ROXICET) 5-325 MG per tablet Take 1 tablet by mouth every 4  (four) hours as needed. Patient not taking: No sig reported 03/28/14   Triplett, Tammy, PA-C    Current Facility-Administered Medications  Medication Dose Route Frequency Provider Last Rate Last Admin  . pantoprazole (PROTONIX) 80 mg in sodium chloride 0.9 % 100 mL (0.8 mg/mL) infusion  8 mg/hr Intravenous Continuous Tilden Fossa, MD      . pantoprazole (PROTONIX) 80 mg in sodium chloride 0.9 % 100 mL IVPB  80 mg Intravenous Once Tilden Fossa, MD      . Melene Muller ON 05/11/2020] pantoprazole (PROTONIX) injection 40 mg  40 mg Intravenous Q12H Tilden Fossa, MD       Current Outpatient Medications  Medication Sig Dispense Refill  . acetaminophen (TYLENOL) 500 MG tablet Take 500 mg by mouth every 6 (six) hours as needed for moderate pain.    . cyclobenzaprine (FLEXERIL) 5 MG tablet Take 1 tablet (5 mg total) by mouth 3 (three) times daily as needed. (Patient not taking: No sig reported) 30 tablet 0  . naproxen (NAPROSYN) 500 MG tablet Take 1 tablet (500 mg total) by mouth 2 (two) times daily with a meal. (Patient not taking: No sig reported) 30 tablet 0  . oxyCODONE-acetaminophen (PERCOCET/ROXICET) 5-325 MG per tablet Take 1 tablet by mouth every 4 (four) hours as needed. (Patient not taking: No sig reported) 24 tablet 0    Allergies as of 05/07/2020  . (No Known Allergies)     Review of Systems:    As per HPI, otherwise negative    Physical Exam:  Vital signs in last 24 hours: Temp:  [98.2 F (36.8 C)] 98.2 F (36.8 C) (12/26 1111) Pulse Rate:  [110-120] 110 (12/26 1200) Resp:  [18-20] 20 (12/26 1200) BP: (100-110)/(55-71) 110/71 (12/26 1200) SpO2:  [99 %-100 %] 100 % (12/26 1200)   General:   Pleasant male in NAD Head:  Normocephalic and atraumatic. Eyes:   No icterus.   Conjunctiva pale. Ears:  Normal auditory acuity. Neck:  Supple Lungs:  Respirations even and unlabored.  Heart:  Regular rate and rhythm Abdomen:  Soft, nondistended, abdominal TTP around  umbilicus Rectal:  Not performed.  Msk:  Symmetrical without gross deformities. R medial ankle with healing wound, scabbed over, no purulence, shackles on ankles Extremities:  Without edema. Neurologic:  Alert and  oriented x4;  grossly normal neurologically. Skin:  Intact without significant lesions or rashes. Psych:  Alert and cooperative. Normal affect.  LAB RESULTS: Recent Labs    05/07/20 1146 05/07/20 1154  WBC 19.0*  --   HGB 9.5* 9.9*  HCT 30.8* 29.0*  PLT 250  --    BMET Recent Labs  05/07/20 1146 05/07/20 1154  NA 136 139  K 4.0 4.1  CL 106 104  CO2 20*  --   GLUCOSE 159* 150*  BUN 38* 38*  CREATININE 0.92 0.70  CALCIUM 8.6*  --    LFT Recent Labs    05/07/20 1146  PROT 6.7  ALBUMIN 2.8*  AST 48*  ALT 80*  ALKPHOS 80  BILITOT 0.1*   PT/INR Recent Labs    05/07/20 1146  LABPROT 14.7  INR 1.2    STUDIES: DG Chest Port 1 View  Result Date: 05/07/2020 CLINICAL DATA:  Shortness of breath, lateral RIGHT ankle wound with drainage EXAM: PORTABLE CHEST 1 VIEW COMPARISON:  Portable exam 1244 hours compared to 12/05/2019 FINDINGS: Normal heart size, mediastinal contours, and pulmonary vascularity. Lungs clear. No infiltrate, pleural effusion, or pneumothorax. Bones demineralized. IMPRESSION: No acute abnormalities. Electronically Signed   By: Ulyses Southward M.D.   On: 05/07/2020 12:52   DG Ankle Right Port  Result Date: 05/07/2020 CLINICAL DATA:  Ankle wound with drainage, initial encounter EXAM: PORTABLE RIGHT ANKLE - 2 VIEW COMPARISON:  12/03/2019 FINDINGS: Multiple fixation screws are noted traversing the calcaneus and medial malleolus. Significant soft tissue swelling is noted. Previously seen calcaneal fracture is again identified and stable. Loss of the plantar arch is noted as well. No new focal abnormality is seen. IMPRESSION: Prior calcaneal fracture with fixation. Generalized soft tissue swelling is seen. No other focal abnormality is noted.  Electronically Signed   By: Alcide Clever M.D.   On: 05/07/2020 12:59      Impression / Plan:   44 year old male, currently incarcerated, presenting with 3 days worth of melena, episode of syncope, mid to lower abdominal pain.  No NSAID use.  He does have longstanding reflux, currently not taking anything for that.  Discussed differential diagnosis for his bleeding with him.  BUN is elevated, very likely upper GI bleed.  He does warrant an upper endoscopy at some point time, question is when.  His Covid test is currently pending.  He otherwise has a significant leukocytosis and has what he endorses a significant abdominal pain in his mid to lower abdomen.  We will initially evaluate him with a CTA to evaluate his abdominal pain and see if this will show [place] any active bleeding.  Pending no significant intra-abdominal pathology will plan on pursuing upper endoscopy next.  This will likely be done tomorrow morning unless significant bleeding in the interim or we have capacity to do later today, pending timing of when his Covid test returns.  All questions answered he agreed with the plan.  Would keep n.p.o. right now, continue IV Protonix empirically, trend hemoglobin and await his course and CT scan initially.  We will follow, Dr. Chales Abrahams assuming GI care of this case tomorrow.  Ileene Patrick, MD [facility]

## 2020-05-07 NOTE — ED Notes (Signed)
Pt to OR short stay 36 

## 2020-05-07 NOTE — ED Provider Notes (Signed)
[facility] EMERGENCY DEPARTMENT Provider Note   CSN: [id number] Arrival date & time: 05/07/20  1115     History No chief complaint on file.   Hunter Bradshaw is a 44 y.o. male.  The history is provided by the patient and the EMS personnel.   Hunter Bradshaw is a 44 y.o. male who presents to the Emergency Department complaining of G.I. bleed. He presents the emergency department by EMS in custody complaining of rectal bleeding for three days. He has been feeling generally weakened over the last three days has been having black stools. He states it's one a day but it is a large quantity. He has no known medical problems. He has been incarcerated for the last two weeks. Over the last five days he has been in the infirmary due to receiving antibiotics for infection to his right foot. He states overall his infection is improving. He has been taking doxycycline BID. He denies any fevers. He does have associated lower abdominal pain. No nausea, vomiting. He is eating and drinking well. He takes 650 mg acetaminophen BID PRN for pain. He does not take aspirin or ibuprofen. No prior similar symptoms. He does smoke cigarettes. No significant alcohol use. No drug use.     Past Medical History:  Diagnosis Date  . Arthritis   . Chronic back pain   . GERD (gastroesophageal reflux disease)   . Hypertension   . Sciatica     Patient Active Problem List   Diagnosis Date Noted  . Upper GI bleed   . Abdominal pain   . Melena   . Open calcaneal fracture 12/01/2019    Past Surgical History:  Procedure Laterality Date  . ANTERIOR CERVICAL DECOMP/DISCECTOMY FUSION    . BACK SURGERY     4 prior back surgeries  . EXTERNAL FIXATION LEG Right 12/01/2019   Procedure: EXTERNAL FIXATION LEG;  Surgeon: Cammy Copa, MD;  Location: [facility] OR;  Service: Orthopedics;  Laterality: Right;  . EXTERNAL FIXATION REMOVAL Right 12/03/2019   Procedure: REMOVAL EXTERNAL FIXATION LEG;  Surgeon:  Myrene Galas, MD;  Location: [facility] OR;  Service: Orthopedics;  Laterality: Right;  . I & D EXTREMITY Bilateral 12/01/2019   Procedure: IRRIGATION AND DEBRIDEMENT OF LEG;  Surgeon: Cammy Copa, MD;  Location: [facility] OR;  Service: Orthopedics;  Laterality: Bilateral;  . I & D EXTREMITY Right 12/03/2019   Procedure: IRRIGATION AND DEBRIDEMENT OF open ankle fracture;  Surgeon: Myrene Galas, MD;  Location: [facility] OR;  Service: Orthopedics;  Laterality: Right;  . KNEE SURGERY     bilateral ACL repair  . NECK SURGERY    . ORIF CALCANEOUS FRACTURE Right 12/03/2019   Procedure: OPEN REDUCTION INTERNAL FIXATION (ORIF) CALCANEOUS FRACTURE;  Surgeon: Myrene Galas, MD;  Location: [facility] OR;  Service: Orthopedics;  Laterality: Right;  . SHOULDER ARTHROSCOPY     Left       No family history on file.  Social History   Tobacco Use  . Smoking status: Current Every Day Smoker    Packs/day: 0.50    Years: 23.00    Pack years: 11.50  Substance Use Topics  . Alcohol use: Yes    Comment: liqour today  . Drug use: Yes    Types: Marijuana    Comment: today    Home Medications Prior to Admission medications   Medication Sig Start Date End Date Taking? Authorizing Provider  acetaminophen (TYLENOL) 500 MG tablet Take 500 mg by mouth every 6 (  six) hours as needed for moderate pain.   Yes [provider]  cyclobenzaprine (FLEXERIL) 5 MG tablet Take 1 tablet (5 mg total) by mouth 3 (three) times daily as needed. Patient not taking: No sig reported 09/08/13   Devoria Albe, MD  naproxen (NAPROSYN) 500 MG tablet Take 1 tablet (500 mg total) by mouth 2 (two) times daily with a meal. Patient not taking: No sig reported 03/28/14   Triplett, Tammy, PA-C  oxyCODONE-acetaminophen (PERCOCET/ROXICET) 5-325 MG per tablet Take 1 tablet by mouth every 4 (four) hours as needed. Patient not taking: No sig reported 03/28/14   Pauline Aus, PA-C    Allergies    Patient has no known allergies.  Review of Systems    Review of Systems  All other systems reviewed and are negative.   Physical Exam Updated Vital Signs BP 110/82   Pulse (!) 104   Temp 98.2 F (36.8 C)   Resp (!) 22   SpO2 100%   Physical Exam Vitals and nursing note reviewed.  Constitutional:      Appearance: He is well-developed and well-nourished.  HENT:     Head: Normocephalic and atraumatic.  Cardiovascular:     Rate and Rhythm: Regular rhythm. Tachycardia present.     Heart sounds: No murmur heard.   Pulmonary:     Effort: Pulmonary effort is normal. No respiratory distress.     Breath sounds: Normal breath sounds.  Abdominal:     Palpations: Abdomen is soft.     Tenderness: There is no guarding or rebound.     Comments: Mild lower abdominal tenderness  Genitourinary:    Comments: Melanotic stool in rectal vault Musculoskeletal:        General: No tenderness or edema.     Comments: Chronic wound to the right ankle. There is eschar an place. No drainage. No erythema or induration.  Skin:    General: Skin is warm and dry.     Coloration: Skin is pale.  Neurological:     Mental Status: He is alert and oriented to person, place, and time.  Psychiatric:        Mood and Affect: Mood and affect normal.        Behavior: Behavior normal.     ED Results / Procedures / Treatments   Labs (all labs ordered are listed, but only abnormal results are displayed) Labs Reviewed  COMPREHENSIVE METABOLIC PANEL - Abnormal; Notable for the following components:      Result Value   CO2 20 (*)    Glucose, Bld 159 (*)    BUN 38 (*)    Calcium 8.6 (*)    Albumin 2.8 (*)    AST 48 (*)    ALT 80 (*)    Total Bilirubin 0.1 (*)    All other components within normal limits  CBC - Abnormal; Notable for the following components:   WBC 19.0 (*)    RBC 3.18 (*)    Hemoglobin 9.5 (*)    HCT 30.8 (*)    RDW 15.8 (*)    All other components within normal limits  LACTIC ACID, PLASMA - Abnormal; Notable for the following  components:   Lactic Acid, Venous 3.1 (*)    All other components within normal limits  LACTIC ACID, PLASMA - Abnormal; Notable for the following components:   Lactic Acid, Venous 2.6 (*)    All other components within normal limits  URINALYSIS, ROUTINE W REFLEX MICROSCOPIC - Abnormal; Notable for the  following components:   Specific Gravity, Urine >1.046 (*)    All other components within normal limits  POC OCCULT BLOOD, ED - Abnormal; Notable for the following components:   Fecal Occult Bld POSITIVE (*)    All other components within normal limits  I-STAT CHEM 8, ED - Abnormal; Notable for the following components:   BUN 38 (*)    Glucose, Bld 150 (*)    TCO2 21 (*)    Hemoglobin 9.9 (*)    HCT 29.0 (*)    All other components within normal limits  RESP PANEL BY RT-PCR (FLU A&B, COVID) ARPGX2  URINE CULTURE  CULTURE, BLOOD (ROUTINE X 2)  CULTURE, BLOOD (ROUTINE X 2)  PROTIME-INR  TYPE AND SCREEN    EKG EKG Interpretation  Date/Time:  Sunday May 07 2020 11:33:01 EST Ventricular Rate:  118 PR Interval:    QRS Duration: 92 QT Interval:  349 QTC Calculation: 489 R Axis:   72 Text Interpretation: Sinus tachycardia Biatrial enlargement Borderline prolonged QT interval Confirmed by Tilden Fossa [phone]) on 05/07/2020 12:33:34 PM   Radiology DG Chest Port 1 View  Result Date: 05/07/2020 CLINICAL DATA:  Shortness of breath, lateral RIGHT ankle wound with drainage EXAM: PORTABLE CHEST 1 VIEW COMPARISON:  Portable exam 1244 hours compared to 12/05/2019 FINDINGS: Normal heart size, mediastinal contours, and pulmonary vascularity. Lungs clear. No infiltrate, pleural effusion, or pneumothorax. Bones demineralized. IMPRESSION: No acute abnormalities. Electronically Signed   By: Ulyses Southward M.D.   On: 05/07/2020 12:52   DG Ankle Right Port  Result Date: 05/07/2020 CLINICAL DATA:  Ankle wound with drainage, initial encounter EXAM: PORTABLE RIGHT ANKLE - 2 VIEW COMPARISON:   12/03/2019 FINDINGS: Multiple fixation screws are noted traversing the calcaneus and medial malleolus. Significant soft tissue swelling is noted. Previously seen calcaneal fracture is again identified and stable. Loss of the plantar arch is noted as well. No new focal abnormality is seen. IMPRESSION: Prior calcaneal fracture with fixation. Generalized soft tissue swelling is seen. No other focal abnormality is noted. Electronically Signed   By: Alcide Clever M.D.   On: 05/07/2020 12:59   CT Angio Abd/Pel W and/or Wo Contrast  Result Date: 05/07/2020 CLINICAL DATA:  44 year old with rectal bleeding. EXAM: CTA ABDOMEN AND PELVIS WITHOUT AND WITH CONTRAST TECHNIQUE: Multidetector CT imaging of the abdomen and pelvis was performed using the standard protocol during bolus administration of intravenous contrast. Multiplanar reconstructed images and MIPs were obtained and reviewed to evaluate the vascular anatomy. CONTRAST:  OMNIPAQUE IOHEXOL 350 MG/ML SOLN COMPARISON:  CT chest, abdomen and pelvis 11/01/2019 FINDINGS: VASCULAR Aorta: Mild atherosclerotic disease in the distal abdominal aorta without aneurysm or dissection. No aortic stenosis. Celiac: Patent without evidence of aneurysm, dissection, vasculitis or significant stenosis. SMA: Patent without evidence of aneurysm, dissection, vasculitis or significant stenosis. Replaced right hepatic artery. Renals: Left renal artery is widely patent without aneurysm or dissection. Marton amount of atherosclerotic disease involving the main right renal artery without significant stenosis. Stave accessory right renal artery. IMA: Patent Inflow: Mild atherosclerotic disease involving the iliac arteries. Common and external iliac arteries are patent bilaterally without significant stenosis. Left internal iliac artery is patent. Right internal iliac artery is patent but there may be atherosclerotic disease involving branch vessels. Proximal Outflow: Proximal femoral  arteries are patent bilaterally. Veins: Portal venous system is patent. Normal appearance of the IVC and renal veins. Iliac veins are unremarkable. Review of the MIP images confirms the above findings. NON-VASCULAR Lower chest: Lung  bases are clear. Hepatobiliary: Liver has a nodular contour, particularly along the inferior right hepatic lobe. Findings are concerning for cirrhosis. No acute abnormality to the gallbladder. No biliary dilatation. Pancreas: Unremarkable. No pancreatic ductal dilatation or surrounding inflammatory changes. Spleen: Normal in size without focal abnormality. Adrenals/Urinary Tract: Normal appearance of both adrenal glands. Normal appearance of the left kidney without stones, renal lesion or hydronephrosis. Mild dilatation of the right renal pelvis and right ureter with a new 3 mm stone in the distal right ureter just proximal to the right ureterovesical junction. In addition, there is a 2 mm stone in the mid right kidney. Urinary bladder is unremarkable. Stomach/Bowel: Stomach is within normal limits. Appendix appears normal. No evidence of bowel wall thickening, distention, or inflammatory changes. No evidence for active GI bleeding. Lymphatic: Artley lymph nodes in the gastrohepatic ligament. Precaval lymph node measures 1.4 cm in short axis and stable. Right external iliac lymph node measures 7 mm in short axis on sequence 10, image 161 and minimally changed from the prior examination. Popov lymph nodes along the anterior pelvic sidewalls and inguinal regions are unchanged from the prior examination. Reproductive: Prostate is unremarkable. Other: Negative for free fluid. Negative for free air. Mi umbilical hernia containing fat. Musculoskeletal: Old fractures involving the left transverse process at L1, L2 and L3. Again noted is pedicle screw and bilateral rod fixation at L4, L5 and S1 with interbody devices. Old fractures of the right tenth, ninth, eighth and seventh ribs.  IMPRESSION: VASCULAR 1. No evidence for active GI bleeding. 2. Mild atherosclerotic disease in the aorta and iliac arteries without significant stenosis. Main visceral arteries are patent. NON-VASCULAR 1. Mild right hydroureteronephrosis secondary to a 3 mm stone in the distal right ureter. Additional tiny right kidney stone. 2. Old fractures involving right ribs and lumbar left transverse processes. Stable appearance of the surgical fusion at L4 through S1. 3. Liver has a nodular contour and concerning for cirrhosis. Electronically Signed   By: Richarda OverlieAdam  Henn M.D.   On: 05/07/2020 14:16    Procedures Procedures (including critical care time) Angiocath insertion Performed by: Tilden FossaElizabeth Sara Keys  Consent: Verbal consent obtained. Risks and benefits: risks, benefits and alternatives were discussed Time out: Immediately prior to procedure a "time out" was called to verify the correct patient, procedure, equipment, support staff and site/side marked as required.  Preparation: Patient was prepped and draped in the usual sterile fashion.  Vein Location: left AC  Ultrasound Guided  Gauge: 20  Normal blood return and flush without difficulty Patient tolerance: Patient tolerated the procedure well with no immediate complications.  CRITICAL CARE Performed by: Tilden FossaElizabeth Hendryx Ricke   Total critical care time: 45 minutes  Critical care time was exclusive of separately billable procedures and treating other patients.  Critical care was necessary to treat or prevent imminent or life-threatening deterioration.  Critical care was time spent personally by me on the following activities: development of treatment plan with patient and/or surrogate as well as nursing, discussions with consultants, evaluation of patient's response to treatment, examination of patient, obtaining history from patient or surrogate, ordering and performing treatments and interventions, ordering and review of laboratory studies, ordering  and review of radiographic studies, pulse oximetry and re-evaluation of patient's condition.    Medications Ordered in ED Medications  pantoprazole (PROTONIX) 80 mg in sodium chloride 0.9 % 100 mL (0.8 mg/mL) infusion (8 mg/hr Intravenous New Bag/Given 05/07/20 1353)  pantoprazole (PROTONIX) injection 40 mg (has no administration in time range)  sodium  chloride 0.9 % bolus 1,000 mL (0 mLs Intravenous Stopped 05/07/20 1313)  sodium chloride 0.9 % bolus 1,000 mL (0 mLs Intravenous Stopped 05/07/20 1313)  pantoprazole (PROTONIX) 80 mg in sodium chloride 0.9 % 100 mL IVPB (0 mg Intravenous Stopped 05/07/20 1344)  iohexol (OMNIPAQUE) 350 MG/ML injection 100 mL (100 mLs Intravenous Contrast Given 05/07/20 1346)    ED Course  I have reviewed the triage vital signs and the nursing notes.  Pertinent labs & imaging results that were available during my care of the patient were reviewed by me and considered in my medical decision making (see chart for details).    MDM Rules/Calculators/A&P                         patient brought in police custody for evaluation of melanotic stools and syncopal event today. He is ill appearing on evaluation with pallor, tachycardia. He has mild tenderness on examination but no peritoneal findings. Given his melena, anemia, tachycardia, soft blood pressures and elevated BUN concern for possible upper G.I. bleed although patient has no risk factors. Lactic acid is elevated and this is thought to be secondary to G.I. bleed. He was treated with IV fluid hydration as well as Protonix bolus and drip. Discussed with Dr. Karsten Ro processor, with G.I. He evaluated the patient in consult and recommended CTA to evaluate for other pathology.  CTA with no evidence of G.I. bleed but does note right ureteral stone with mild Hydro. Initially patient only reported chronic back pain but on reassessment he did state that he has had some pain on the right flank for the last two weeks. He also  reports temperature to 100 at home three weeks ago. He is currently on antibiotics for a soft tissue infection to the foot. His urinalysis today is not consistent with UTI. He does have leukocytosis on his CBC, question if this is secondary to infection versus demargination due to stress reaction from his G.I. bleed. Discussed with Dr. Cardell Peach, with urology who will will reviewed the patient's images and studies.  Patient updated of findings of studies recommendation for admission for ongoing treatment and he is in agreement with treatment plan. Medicine consulted for admission for ongoing treatment.  Final Clinical Impression(s) / ED Diagnoses Final diagnoses:  Wound drainage    Rx / DC Orders ED Discharge Orders    None       Tilden Fossa, MD 05/07/20 [phone]

## 2020-05-07 NOTE — ED Triage Notes (Signed)
Patient arrives by [facility] from jail with report of weakness and dark stools x 3 days. Patient pale on arrival. Patient complains of lower abdominal pain, EMS unable to establish IV.

## 2020-05-07 NOTE — Anesthesia Procedure Notes (Signed)
Procedure Name: Intubation Date/Time: 05/07/2020 5:48 PM Performed by: Edmonia Caprio, CRNA Pre-anesthesia Checklist: Patient identified, Emergency Drugs available, Suction available, Patient being monitored and Timeout performed Patient Re-evaluated:Patient Re-evaluated prior to induction Oxygen Delivery Method: Circle system utilized Preoxygenation: Pre-oxygenation with 100% oxygen Induction Type: IV induction and Rapid sequence Laryngoscope Size: Miller and 2 Grade View: Grade I Tube type: Oral Tube size: 7.5 mm Number of attempts: 1 Airway Equipment and Method: Stylet Placement Confirmation: ETT inserted through vocal cords under direct vision,  positive ETCO2 and breath sounds checked- equal and bilateral Secured at: 22 cm Tube secured with: Tape Dental Injury: Teeth and Oropharynx as per pre-operative assessment

## 2020-05-07 NOTE — Consult Note (Signed)
Consultation  Referring Provider:     Tilden Fossa Primary Care Physician:  Juliette Alcide, MD Primary Gastroenterologist:        Gentry Fitz Reason for Consultation:     GI bleeding         HPI:   Hunter Bradshaw is a 44 y.o. male with a medical history of GERD, hypertension, back pain, currently incarcerated, presenting to the hospital with melena, abdominal pain, syncope.  He states over the past 3 days he has not been feeling well.  He has been having 1 black bowel movement per day over the past 3 days.  He has been feeling lightheadedness and dizziness and had a [facility]opal [facility] the jail.  He has been having some low to mid abdominal pain which has been ongoing since this time as well.  Pain not related to bowel movements it seems to be persistent.  He states it is "killing him right now".  He denies NSAID use.  He is never had an EGD or colonoscopy before.  He denies any history of GI bleeding.  He has an infection in his right ankle/foot which has drained pus in recent days.  He has been on doxycycline for that, thinks it is getting better.  Has not had any purulent drainage in a few days from that.  Upon arrival in the ED his hemoglobin was 9.5 with a white blood cell count of 19.0.  Baseline hemoglobin nines to tens back in July the last time he was admitted.  Lactic acid of 3.1.  BUN of 38.,  Creatinine normal. HR around 100, BP 110s/90s.  He is feeling better since he has had some fluids in the ED.  Covid test pending    Past Medical History:  Diagnosis Date  . Arthritis   . Chronic back pain   . GERD (gastroesophageal reflux disease)   . Hypertension   . Sciatica     Past Surgical History:  Procedure Laterality Date  . ANTERIOR CERVICAL DECOMP/DISCECTOMY FUSION    . BACK SURGERY     4 prior back surgeries  . EXTERNAL FIXATION LEG Right 12/01/2019   Procedure: EXTERNAL FIXATION LEG;  Surgeon: Cammy Copa, MD;  Location[facility];  Service: Orthopedics;   Laterality: Right;  . EXTERNAL FIXATION REMOVAL Right 12/03/2019   Procedure: REMOVAL EXTERNAL FIXATION LEG;  Surgeon: Myrene Galas, MD;  Location: [facility] OR;  Service: Orthopedics;  Laterality: Right;  . I & D EXTREMITY Bilateral 12/01/2019   Procedure: IRRIGATION AND DEBRIDEMENT OF LEG;  Surgeon: Cammy Copa, MD;  Location: [facility] OR;  Service: Orthopedics;  Laterality: Bilateral;  . I & D EXTREMITY Right 12/03/2019   Procedure: IRRIGATION AND DEBRIDEMENT OF open ankle fracture;  Surgeon: Myrene Galas, MD;  Location: [facility] OR;  Service: Orthopedics;  Laterality: Right;  . KNEE SURGERY     bilateral ACL repair  . NECK SURGERY    . ORIF CALCANEOUS FRACTURE Right 12/03/2019   Procedure: OPEN REDUCTION INTERNAL FIXATION (ORIF) CALCANEOUS FRACTURE;  Surgeon: Myrene Galas, MD;  Location: [facility] OR;  Service: Orthopedics;  Laterality: Right;  . SHOULDER ARTHROSCOPY     Left    No family history on file.  Patient denies any family history of malignancy of the GI tract  Social History   Tobacco Use  . Smoking status: Current Every Day Smoker    Packs/day: 0.50    Years: 23.00    Pack years: 11.50  Substance Use Topics  .  Alcohol use: Yes    Comment: liqour today  . Drug use: Yes    Types: Marijuana    Comment: today    Prior to Admission medications   Medication Sig Start Date End Date Taking? Authorizing Provider  acetaminophen (TYLENOL) 500 MG tablet Take 500 mg by mouth every 6 (six) hours as needed for moderate pain.   Yes [provider]  cyclobenzaprine (FLEXERIL) 5 MG tablet Take 1 tablet (5 mg total) by mouth 3 (three) times daily as needed. Patient not taking: No sig reported 09/08/13   Devoria Albe, MD  naproxen (NAPROSYN) 500 MG tablet Take 1 tablet (500 mg total) by mouth 2 (two) times daily with a meal. Patient not taking: No sig reported 03/28/14   Triplett, Tammy, PA-C  oxyCODONE-acetaminophen (PERCOCET/ROXICET) 5-325 MG per tablet Take 1 tablet by mouth every 4  (four) hours as needed. Patient not taking: No sig reported 03/28/14   Triplett, Tammy, PA-C    Current Facility-Administered Medications  Medication Dose Route Frequency Provider Last Rate Last Admin  . pantoprazole (PROTONIX) 80 mg in sodium chloride 0.9 % 100 mL (0.8 mg/mL) infusion  8 mg/hr Intravenous Continuous Tilden Fossa, MD      . pantoprazole (PROTONIX) 80 mg in sodium chloride 0.9 % 100 mL IVPB  80 mg Intravenous Once Tilden Fossa, MD      . Melene Muller ON 05/11/2020] pantoprazole (PROTONIX) injection 40 mg  40 mg Intravenous Q12H Tilden Fossa, MD       Current Outpatient Medications  Medication Sig Dispense Refill  . acetaminophen (TYLENOL) 500 MG tablet Take 500 mg by mouth every 6 (six) hours as needed for moderate pain.    . cyclobenzaprine (FLEXERIL) 5 MG tablet Take 1 tablet (5 mg total) by mouth 3 (three) times daily as needed. (Patient not taking: No sig reported) 30 tablet 0  . naproxen (NAPROSYN) 500 MG tablet Take 1 tablet (500 mg total) by mouth 2 (two) times daily with a meal. (Patient not taking: No sig reported) 30 tablet 0  . oxyCODONE-acetaminophen (PERCOCET/ROXICET) 5-325 MG per tablet Take 1 tablet by mouth every 4 (four) hours as needed. (Patient not taking: No sig reported) 24 tablet 0    Allergies as of 05/07/2020  . (No Known Allergies)     Review of Systems:    As per HPI, otherwise negative    Physical Exam:  Vital signs in last 24 hours: Temp:  [98.2 F (36.8 C)] 98.2 F (36.8 C) (12/26 1111) Pulse Rate:  [110-120] 110 (12/26 1200) Resp:  [18-20] 20 (12/26 1200) BP: (100-110)/(55-71) 110/71 (12/26 1200) SpO2:  [99 %-100 %] 100 % (12/26 1200)   General:   Pleasant male in NAD Head:  Normocephalic and atraumatic. Eyes:   No icterus.   Conjunctiva pale. Ears:  Normal auditory acuity. Neck:  Supple Lungs:  Respirations even and unlabored.  Heart:  Regular rate and rhythm Abdomen:  Soft, nondistended, abdominal TTP around  umbilicus Rectal:  Not performed.  Msk:  Symmetrical without gross deformities. R medial ankle with healing wound, scabbed over, no purulence, shackles on ankles Extremities:  Without edema. Neurologic:  Alert and  oriented x4;  grossly normal neurologically. Skin:  Intact without significant lesions or rashes. Psych:  Alert and cooperative. Normal affect.  LAB RESULTS: Recent Labs    05/07/20 1146 05/07/20 1154  WBC 19.0*  --   HGB 9.5* 9.9*  HCT 30.8* 29.0*  PLT 250  --    BMET Recent Labs  05/07/20 1146 05/07/20 1154  NA 136 139  K 4.0 4.1  CL 106 104  CO2 20*  --   GLUCOSE 159* 150*  BUN 38* 38*  CREATININE 0.92 0.70  CALCIUM 8.6*  --    LFT Recent Labs    05/07/20 1146  PROT 6.7  ALBUMIN 2.8*  AST 48*  ALT 80*  ALKPHOS 80  BILITOT 0.1*   PT/INR Recent Labs    05/07/20 1146  LABPROT 14.7  INR 1.2    STUDIES: DG Chest Port 1 View  Result Date: 05/07/2020 CLINICAL DATA:  Shortness of breath, lateral RIGHT ankle wound with drainage EXAM: PORTABLE CHEST 1 VIEW COMPARISON:  Portable exam 1244 hours compared to 12/05/2019 FINDINGS: Normal heart size, mediastinal contours, and pulmonary vascularity. Lungs clear. No infiltrate, pleural effusion, or pneumothorax. Bones demineralized. IMPRESSION: No acute abnormalities. Electronically Signed   By: Ulyses Southward M.D.   On: 05/07/2020 12:52   DG Ankle Right Port  Result Date: 05/07/2020 CLINICAL DATA:  Ankle wound with drainage, initial encounter EXAM: PORTABLE RIGHT ANKLE - 2 VIEW COMPARISON:  12/03/2019 FINDINGS: Multiple fixation screws are noted traversing the calcaneus and medial malleolus. Significant soft tissue swelling is noted. Previously seen calcaneal fracture is again identified and stable. Loss of the plantar arch is noted as well. No new focal abnormality is seen. IMPRESSION: Prior calcaneal fracture with fixation. Generalized soft tissue swelling is seen. No other focal abnormality is noted.  Electronically Signed   By: Alcide Clever M.D.   On: 05/07/2020 12:59      Impression / Plan:   44 year old male, currently incarcerated, presenting with 3 days worth of melena, episode of syncope, mid to lower abdominal pain.  No NSAID use.  He does have longstanding reflux, currently not taking anything for that.  Discussed differential diagnosis for his bleeding with him.  BUN is elevated, very likely upper GI bleed.  He does warrant an upper endoscopy at some point time, question is when.  His Covid test is currently pending.  He otherwise has a significant leukocytosis and has what he endorses a significant abdominal pain in his mid to lower abdomen.  We will initially evaluate him with a CTA to evaluate his abdominal pain and see if this will show [place] any active bleeding.  Pending no significant intra-abdominal pathology will plan on pursuing upper endoscopy next.  This will likely be done tomorrow morning unless significant bleeding in the interim or we have capacity to do later today, pending timing of when his Covid test returns.  All questions answered he agreed with the plan.  Would keep n.p.o. right now, continue IV Protonix empirically, trend hemoglobin and await his course and CT scan initially.  We will follow, Dr. Chales Abrahams assuming GI care of this case tomorrow.  Ileene Patrick, MD [facility]

## 2020-05-07 NOTE — Progress Notes (Signed)
Pharmacy Antibiotic Note  Hunter Bradshaw is a 44 y.o. male admitted on 05/07/2020 with foot wound infection with purulent drainage s/p doxycycline x5d PTA.  Pharmacy has been consulted for vancomycin dosing.  Patient went from ED to OR for R ureteral stone removal, will received vancomycin after OR. Good renal function.   Plan: Vancomycin 2g load then 1.5g Q12hr Monitor cultures, clinical status, renal fx Narrow abx as able and f/u duration   Weight: 111.1 kg (245 lb)  Temp (24hrs), Avg:98.2 F (36.8 C), Min:98.2 F (36.8 C), Max:98.2 F (36.8 C)  Recent Labs  Lab 05/07/20 1146 05/07/20 1154 05/07/20 1409  WBC 19.0*  --   --   CREATININE 0.92 0.70  --   LATICACIDVEN 3.1*  --  2.6*    Estimated Creatinine Clearance: 141.8 mL/min (by C-G formula based on SCr of 0.7 mg/dL).    No Known Allergies  Antimicrobials this admission: Vanc 12/26 >>   Microbiology results: 12/26 BCx: pend 12/26 UCx: pend    Thank you for allowing pharmacy to be a part of this patient's care.  Alphia Moh, PharmD, BCPS, BCCP Clinical Pharmacist  Please check AMION for all [facility] phone numbers After 10:00 PM, call Main Pharmacy [phone]

## 2020-05-07 NOTE — Op Note (Signed)
Operative Note  Preoperative diagnosis:  1.  Right ureteral stone  Postoperative diagnosis: 1.  Right ureteral stone  Procedure(s): 1.  Cystoscopy 2. Right retrograde pyelogram with interpretation 3. Right ureteral stent placement  Surgeon: Jettie Pagan, MD  Assistants:  None  Anesthesia:  General  Complications:  None  EBL:  minimal  Specimens: 1. none  Drains/Catheters: 1.  Right 6Fr x 26cm ureteral stent  Intraoperative findings:   1. Cystoscopy demonstrated no suspicious lesions, masses, stones. 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 2. Right Retrograde pyelogram demonstrated moderated right hydronephrosis. 3. Successful right ureteral stent placement with curl in the renal pelvis and bladder respectively.  Indication:  Hunter Bradshaw is a 44 y.o. male with right distal ureteral stone, persistent pain and evidence of infection, unclear if relating to stone pathology. After reviewing the management options for treatment, he elected to proceed with the above surgical procedure(s). We have discussed the potential benefits and risks of the procedure, side effects of the proposed treatment, the likelihood of the patient achieving the goals of the procedure, and any potential problems that might occur during the procedure or recuperation. Informed consent has been obtained.  Description of procedure: The patient was taken to the operating room and general anesthesia was induced.  The patient was placed in the dorsal lithotomy position, prepped and draped in the usual sterile fashion, and preoperative antibiotics were administered. A preoperative time-out was performed.   Cystourethroscopy was performed.  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 There was some bilobar prostatic hypertrophy. The bladder was  then systematically examined in its entirety. There was no evidence for any bladder tumors, stones, or other mucosal pathology.    Attention then turned to the right ureteral orifice. A 0.038 zip wire was passed through the right orifice and over the wire a 5 Fr open ended catheter was inserted and passed up to the level of the renal pelvis. Omnipaque contrast was injected through the ureteral catheter and a retrograde pyelogram was performed with findings as dictated above. The wire was then replaced and the open ended catheter was removed.   A 6Fr x 26cm ureteral stent was advance over the wire. The stent was positioned appropriately under fluoroscopic and cystoscopic guidance.  The wire was then removed with an adequate stent curl noted in the renal pelvis as well as in the bladder.  The bladder was then emptied and the procedure ended.  The patient appeared to tolerate the procedure well and without complications.  The patient was able to be awakened and transferred to the recovery unit in satisfactory condition.   Plan:  Leave stent in place. F/u with [facility] to schedule R ureteroscopy with definitive treatment of stone.  Matt R. Leanny Moeckel MD [facility]  Pager: [phone]

## 2020-05-07 NOTE — Anesthesia Preprocedure Evaluation (Addendum)
Anesthesia Evaluation  Patient identified by MRN, date of birth, ID band Patient awake    Reviewed: Allergy & Precautions, NPO status , Patient's Chart, lab work & pertinent test results  Airway Mallampati: I  TM Distance: >3 FB Neck ROM: Full    Dental  (+) Poor Dentition Very poor dentition with multiple blackened and broken teeth.  One lower front tooth intact but with decay at root base.  Discussed at length and dental advisory given.:   Pulmonary Current Smoker and Patient abstained from smoking.,    Pulmonary exam normal        Cardiovascular hypertension, Pt. on medications Normal cardiovascular exam     Neuro/Psych    GI/Hepatic GERD  Medicated and Controlled,  Endo/Other    Renal/GU      Musculoskeletal   Abdominal   Peds  Hematology   Anesthesia Other Findings Adm w GI bleed, melena, nausea   Reproductive/Obstetrics                           Anesthesia Physical Anesthesia Plan  ASA: II  Anesthesia Plan: General   Post-op Pain Management:    Induction: Intravenous  PONV Risk Score and Plan: 1 and Ondansetron  Airway Management Planned: Oral ETT  Additional Equipment:   Intra-op Plan:   Post-operative Plan: Extubation in OR  Informed Consent: I have reviewed the patients History and Physical, chart, labs and discussed the procedure including the risks, benefits and alternatives for the proposed anesthesia with the patient or authorized representative who has indicated his/her understanding and acceptance.       Plan Discussed with: CRNA and Surgeon  Anesthesia Plan Comments:         Anesthesia Quick Evaluation

## 2020-05-07 NOTE — Consult Note (Signed)
Urology Consult   Physician requesting consult: Quintella Reichert, MD  Reason for consult: Right distal ureteral stone  History of Present Illness: Hunter Bradshaw is a 44 y.o. who presented with right sided abdominal pain, GI bleed, right flank and lower back pain. He also has a right foot infection for which he has been taking doxycycline. He states he has had right sided flank pain for the past 2 weeks. He states he has had fevers 1-2 weeks ago, but nothing recent. He still reports right flank pian. He does have a history of urolithiasis and has passed several stones. He has never required procedures for ureteral stones. He last ate at [facility]. He is COVID negative.  Past Medical History:  Diagnosis Date  . Arthritis   . Chronic back pain   . GERD (gastroesophageal reflux disease)   . Hypertension   . Sciatica     Past Surgical History:  Procedure Laterality Date  . ANTERIOR CERVICAL DECOMP/DISCECTOMY FUSION    . BACK SURGERY     4 prior back surgeries  . EXTERNAL FIXATION LEG Right 12/01/2019   Procedure: EXTERNAL FIXATION LEG;  Surgeon: Meredith Pel, MD;  Location: [place];  Service: Orthopedics;  Laterality: Right;  . EXTERNAL FIXATION REMOVAL Right 12/03/2019   Procedure: REMOVAL EXTERNAL FIXATION LEG;  Surgeon: Altamese [place]ana, MD;  Location: [place];  Service: Orthopedics;  Laterality: Right;  . I & D EXTREMITY Bilateral 12/01/2019   Procedure: IRRIGATION AND DEBRIDEMENT OF LEG;  Surgeon: Meredith Pel, MD;  Location: [place];  Service: Orthopedics;  Laterality: Bilateral;  . I & D EXTREMITY Right 12/03/2019   Procedure: IRRIGATION AND DEBRIDEMENT OF open ankle fracture;  Surgeon: Altamese Mission, MD;  Location: [place];  Service: Orthopedics;  Laterality: Right;  . KNEE SURGERY     bilateral ACL repair  . NECK SURGERY    . ORIF CALCANEOUS FRACTURE Right 12/03/2019   Procedure: OPEN REDUCTION INTERNAL FIXATION (ORIF) CALCANEOUS FRACTURE;  Surgeon: Altamese [place], MD;  Location: [place];  Service: Orthopedics;  Laterality: Right;  . SHOULDER ARTHROSCOPY     Left    Current Hospital Medications:  Home Meds:  No current facility-administered medications on file prior to encounter.   Current Outpatient Medications on File Prior to Encounter  Medication Sig Dispense Refill  . acetaminophen (TYLENOL) 500 MG tablet Take 500 mg by mouth every 6 (six) hours as needed for moderate pain.    . cyclobenzaprine (FLEXERIL) 5 MG tablet Take 1 tablet (5 mg total) by mouth 3 (three) times daily as needed. (Patient not taking: No sig reported) 30 tablet 0  . naproxen (NAPROSYN) 500 MG tablet Take 1 tablet (500 mg total) by mouth 2 (two) times daily with a meal. (Patient not taking: No sig reported) 30 tablet 0  . oxyCODONE-acetaminophen (PERCOCET/ROXICET) 5-325 MG per tablet Take 1 tablet by mouth every 4 (four) hours as needed. (Patient not taking: No sig reported) 24 tablet 0     Scheduled Meds: . [START ON 05/11/2020] pantoprazole  40 mg Intravenous Q12H   Continuous Infusions: . pantoprozole (PROTONIX) infusion 8 mg/hr (05/07/20 1353)   PRN Meds:.  Allergies: No Known Allergies  No family history on file.  Social History:  reports that he has been smoking. He has a 11.50 pack-year smoking history. He does not have any smokeless tobacco history on file. He reports current alcohol use. He reports current drug use. Drug: Marijuana.  ROS: A complete review of systems was  performed.  All systems are negative except for pertinent findings as noted.  Physical Exam:  Vital signs in last 24 hours: Temp:  [98.2 F (36.8 C)] 98.2 F (36.8 C) (12/26 1111) Pulse Rate:  [94-120] 104 (12/26 1500) Resp:  [18-22] 22 (12/26 1500) BP: (100-112)/(55-82) 110/82 (12/26 1500) SpO2:  [99 %-100 %] 100 % (12/26 1500) Constitutional:  Alert and oriented, No acute distress Cardiovascular: Regular rate and rhythm Respiratory: Normal respiratory effort, Lungs clear bilaterally GI: Abdomen is  soft, ND, tender on right side GU: No CVA tenderness Neurologic: Grossly intact, no focal deficits Psychiatric: Normal mood and affect  Laboratory Data:  Recent Labs    05/07/20 1146 05/07/20 1154  WBC 19.0*  --   HGB 9.5* 9.9*  HCT 30.8* 29.0*  PLT 250  --     Recent Labs    05/07/20 1146 05/07/20 1154  NA 136 139  K 4.0 4.1  CL 106 104  GLUCOSE 159* 150*  BUN 38* 38*  CALCIUM 8.6*  --   CREATININE 0.92 0.70     Results for orders placed or performed during the hospital encounter of 05/07/20 (from the past 24 hour(s))  Comprehensive metabolic panel     Status: Abnormal   Collection Time: 05/07/20 11:46 AM  Result Value Ref Range   Sodium 136 135 - 145 mmol/L   Potassium 4.0 3.5 - 5.1 mmol/L   Chloride 106 98 - 111 mmol/L   CO2 20 (L) 22 - 32 mmol/L   Glucose, Bld 159 (H) 70 - 99 mg/dL   BUN 38 (H) 6 - 20 mg/dL   Creatinine, Ser 0.92 0.61 - 1.24 mg/dL   Calcium 8.6 (L) 8.9 - 10.3 mg/dL   Total Protein 6.7 6.5 - 8.1 g/dL   Albumin 2.8 (L) 3.5 - 5.0 g/dL   AST 48 (H) 15 - 41 U/L   ALT 80 (H) 0 - 44 U/L   Alkaline Phosphatase 80 38 - 126 U/L   Total Bilirubin 0.1 (L) 0.3 - 1.2 mg/dL   GFR, Estimated >60 >60 mL/min   Anion gap 10 5 - 15  CBC     Status: Abnormal   Collection Time: 05/07/20 11:46 AM  Result Value Ref Range   WBC 19.0 (H) 4.0 - 10.5 K/uL   RBC 3.18 (L) 4.22 - 5.81 MIL/uL   Hemoglobin 9.5 (L) 13.0 - 17.0 g/dL   HCT 30.8 (L) 39.0 - 52.0 %   MCV 96.9 80.0 - 100.0 fL   MCH 29.9 26.0 - 34.0 pg   MCHC 30.8 30.0 - 36.0 g/dL   RDW 15.8 (H) 11.5 - 15.5 %   Platelets 250 150 - 400 K/uL   nRBC 0.0 0.0 - 0.2 %  Type and screen [place]     Status: None   Collection Time: 05/07/20 11:46 AM  Result Value Ref Range   ABO/RH(D) A POS    Antibody Screen NEG    Sample Expiration      05/10/2020,2359 Performed at [facility], [address], [place], [place] [address]   Protime-INR - (order if Patient is taking Coumadin /  Warfarin)     Status: None   Collection Time: 05/07/20 11:46 AM  Result Value Ref Range   Prothrombin Time 14.7 11.4 - 15.2 seconds   INR 1.2 0.8 - 1.2  Lactic acid, plasma     Status: Abnormal   Collection Time: 05/07/20 11:46 AM  Result Value Ref Range  Lactic Acid, Venous 3.1 (HH) 0.5 - 1.9 mmol/L  I-stat chem 8, ED (not at [facility] or [facility])     Status: Abnormal   Collection Time: 05/07/20 11:54 AM  Result Value Ref Range   Sodium 139 135 - 145 mmol/L   Potassium 4.1 3.5 - 5.1 mmol/L   Chloride 104 98 - 111 mmol/L   BUN 38 (H) 6 - 20 mg/dL   Creatinine, Ser 0.70 0.61 - 1.24 mg/dL   Glucose, Bld 150 (H) 70 - 99 mg/dL   Calcium, Ion 1.22 1.15 - 1.40 mmol/L   TCO2 21 (L) 22 - 32 mmol/L   Hemoglobin 9.9 (L) 13.0 - 17.0 g/dL   HCT 29.0 (L) 39.0 - 52.0 %  POC occult blood, ED     Status: Abnormal   Collection Time: 05/07/20 12:07 PM  Result Value Ref Range   Fecal Occult Bld POSITIVE (A) NEGATIVE  Resp Panel by RT-PCR (Flu A&B, Covid) Nasopharyngeal Swab     Status: None   Collection Time: 05/07/20 12:56 PM   Specimen: Nasopharyngeal Swab; Nasopharyngeal(NP) swabs in vial transport medium  Result Value Ref Range   SARS Coronavirus 2 by RT PCR NEGATIVE NEGATIVE   Influenza A by PCR NEGATIVE NEGATIVE   Influenza B by PCR NEGATIVE NEGATIVE  Lactic acid, plasma     Status: Abnormal   Collection Time: 05/07/20  2:09 PM  Result Value Ref Range   Lactic Acid, Venous 2.6 (HH) 0.5 - 1.9 mmol/L  Urinalysis, Routine w reflex microscopic Urine, Catheterized     Status: Abnormal   Collection Time: 05/07/20  2:38 PM  Result Value Ref Range   Color, Urine YELLOW YELLOW   APPearance CLEAR CLEAR   Specific Gravity, Urine >1.046 (H) 1.005 - 1.030   pH 5.0 5.0 - 8.0   Glucose, UA NEGATIVE NEGATIVE mg/dL   Hgb urine dipstick NEGATIVE NEGATIVE   Bilirubin Urine NEGATIVE NEGATIVE   Ketones, ur NEGATIVE NEGATIVE mg/dL   Protein, ur NEGATIVE NEGATIVE mg/dL   Nitrite NEGATIVE NEGATIVE    Leukocytes,Ua NEGATIVE NEGATIVE   Recent Results (from the past 240 hour(s))  Resp Panel by RT-PCR (Flu A&B, Covid) Nasopharyngeal Swab     Status: None   Collection Time: 05/07/20 12:56 PM   Specimen: Nasopharyngeal Swab; Nasopharyngeal(NP) swabs in vial transport medium  Result Value Ref Range Status   SARS Coronavirus 2 by RT PCR NEGATIVE NEGATIVE Final    Comment: (NOTE) SARS-CoV-2 target nucleic acids are NOT DETECTED.  The SARS-CoV-2 RNA is generally detectable in upper respiratory specimens during the acute phase of infection. The lowest concentration of SARS-CoV-2 viral copies this assay can detect is 138 copies/mL. A negative result does not preclude SARS-Cov-2 infection and should not be used as the sole basis for treatment or other patient management decisions. A negative result may occur with  improper specimen collection/handling, submission of specimen other than nasopharyngeal swab, presence of viral mutation(s) within the areas targeted by this assay, and inadequate number of viral copies(<138 copies/mL). A negative result must be combined with clinical observations, patient history, and epidemiological information. The expected result is Negative.  Fact Sheet for Patients:  EntrepreneurPulse.com.au  Fact Sheet for Healthcare Providers:  IncredibleEmployment.be  This test is no t yet approved or cleared by the [place] FDA and  has been authorized for detection and/or diagnosis of SARS-CoV-2 by FDA under an Emergency Use Authorization (EUA). This EUA will remain  in effect (meaning this test can be used) for the  duration of the COVID-19 declaration under Section 564(b)(1) of the Act, 21 U.S.C.section 360bbb-3(b)(1), unless the authorization is terminated  or revoked sooner.       Influenza A by PCR NEGATIVE NEGATIVE Final   Influenza B by PCR NEGATIVE NEGATIVE Final    Comment: (NOTE) The Xpert Xpress  SARS-CoV-2/FLU/RSV plus assay is intended as an aid in the diagnosis of influenza from Nasopharyngeal swab specimens and should not be used as a sole basis for treatment. Nasal washings and aspirates are unacceptable for Xpert Xpress SARS-CoV-2/FLU/RSV testing.  Fact Sheet for Patients: EntrepreneurPulse.com.au  Fact Sheet for Healthcare Providers: IncredibleEmployment.be  This test is not yet approved or cleared by the [place] FDA and has been authorized for detection and/or diagnosis of SARS-CoV-2 by FDA under an Emergency Use Authorization (EUA). This EUA will remain in effect (meaning this test can be used) for the duration of the COVID-19 declaration under Section 564(b)(1) of the Act, 21 U.S.C. section 360bbb-3(b)(1), unless the authorization is terminated or revoked.  Performed at [facility], [place] [address], [place], [place] [address]     Renal Function: Recent Labs    05/07/20 1146 05/07/20 1154  CREATININE 0.92 0.70   CrCl cannot be calculated (Unknown ideal weight.).  Radiologic Imaging: DG Chest Port 1 View  Result Date: 05/07/2020 CLINICAL DATA:  Shortness of breath, lateral RIGHT ankle wound with drainage EXAM: PORTABLE CHEST 1 VIEW COMPARISON:  Portable exam 7846 hours compared to 12/05/2019 FINDINGS: Normal heart size, mediastinal contours, and pulmonary vascularity. Lungs clear. No infiltrate, pleural effusion, or pneumothorax. Bones demineralized. IMPRESSION: No acute abnormalities. Electronically Signed   By: Lavonia Dana M.D.   On: 05/07/2020 12:52   DG Ankle Right Port  Result Date: 05/07/2020 CLINICAL DATA:  Ankle wound with drainage, initial encounter EXAM: PORTABLE RIGHT ANKLE - 2 VIEW COMPARISON:  12/03/2019 FINDINGS: Multiple fixation screws are noted traversing the calcaneus and medial malleolus. Significant soft tissue swelling is noted. Previously seen calcaneal fracture is again identified and  stable. Loss of the plantar arch is noted as well. No new focal abnormality is seen. IMPRESSION: Prior calcaneal fracture with fixation. Generalized soft tissue swelling is seen. No other focal abnormality is noted. Electronically Signed   By: Inez Catalina M.D.   On: 05/07/2020 12:59   CT Angio Abd/Pel W and/or Wo Contrast  Result Date: 05/07/2020 CLINICAL DATA:  44 year old with rectal bleeding. EXAM: CTA ABDOMEN AND PELVIS WITHOUT AND WITH CONTRAST TECHNIQUE: Multidetector CT imaging of the abdomen and pelvis was performed using the standard protocol during bolus administration of intravenous contrast. Multiplanar reconstructed images and MIPs were obtained and reviewed to evaluate the vascular anatomy. CONTRAST:  182m OMNIPAQUE IOHEXOL 350 MG/ML SOLN COMPARISON:  CT chest, abdomen and pelvis 11/01/2019 FINDINGS: VASCULAR Aorta: Mild atherosclerotic disease in the distal abdominal aorta without aneurysm or dissection. No aortic stenosis. Celiac: Patent without evidence of aneurysm, dissection, vasculitis or significant stenosis. SMA: Patent without evidence of aneurysm, dissection, vasculitis or significant stenosis. Replaced right hepatic artery. Renals: Left renal artery is widely patent without aneurysm or dissection. Gouin amount of atherosclerotic disease involving the main right renal artery without significant stenosis. Gratz accessory right renal artery. IMA: Patent Inflow: Mild atherosclerotic disease involving the iliac arteries. Common and external iliac arteries are patent bilaterally without significant stenosis. Left internal iliac artery is patent. Right internal iliac artery is patent but there may be atherosclerotic disease involving branch vessels. Proximal Outflow: Proximal femoral arteries are patent bilaterally. Veins:  Portal venous system is patent. Normal appearance of the IVC and renal veins. Iliac veins are unremarkable. Review of the MIP images confirms the above findings.  NON-VASCULAR Lower chest: Lung bases are clear. Hepatobiliary: Liver has a nodular contour, particularly along the inferior right hepatic lobe. Findings are concerning for cirrhosis. No acute abnormality to the gallbladder. No biliary dilatation. Pancreas: Unremarkable. No pancreatic ductal dilatation or surrounding inflammatory changes. Spleen: Normal in size without focal abnormality. Adrenals/Urinary Tract: Normal appearance of both adrenal glands. Normal appearance of the left kidney without stones, renal lesion or hydronephrosis. Mild dilatation of the right renal pelvis and right ureter with a new 3 mm stone in the distal right ureter just proximal to the right ureterovesical junction. In addition, there is a 2 mm stone in the mid right kidney. Urinary bladder is unremarkable. Stomach/Bowel: Stomach is within normal limits. Appendix appears normal. No evidence of bowel wall thickening, distention, or inflammatory changes. No evidence for active GI bleeding. Lymphatic: Youtsey lymph nodes in the gastrohepatic ligament. Precaval lymph node measures 1.4 cm in short axis and stable. Right external iliac lymph node measures 7 mm in short axis on sequence 10, image 161 and minimally changed from the prior examination. Koral lymph nodes along the anterior pelvic sidewalls and inguinal regions are unchanged from the prior examination. Reproductive: Prostate is unremarkable. Other: Negative for free fluid. Negative for free air. Ausburn umbilical hernia containing fat. Musculoskeletal: Old fractures involving the left transverse process at L1, L2 and L3. Again noted is pedicle screw and bilateral rod fixation at L4, L5 and S1 with interbody devices. Old fractures of the right tenth, ninth, eighth and seventh ribs. IMPRESSION: VASCULAR 1. No evidence for active GI bleeding. 2. Mild atherosclerotic disease in the aorta and iliac arteries without significant stenosis. Main visceral arteries are patent. NON-VASCULAR 1. Mild  right hydroureteronephrosis secondary to a 3 mm stone in the distal right ureter. Additional tiny right kidney stone. 2. Old fractures involving right ribs and lumbar left transverse processes. Stable appearance of the surgical fusion at L4 through S1. 3. Liver has a nodular contour and concerning for cirrhosis. Electronically Signed   By: Markus Daft M.D.   On: 05/07/2020 14:16    I independently reviewed the above imaging studies.  Impression/Recommendation 1. Right distal ureteral stone: Measures 25m on CTA A/P 12/26 with mild right hydro. Also present tiny right renal stone. Afebrile in ED, WBC 19, Cr 0.7, UA without sign of infection, UCx pending. 2.  Upper GI bleed 3. Right foot infection  -Discussed options including observing with MET, vs stenting vs treatment of ureteral stone if distal enough and will be able to be achieved [facility] -UA without sign of UTI. -Pt with persistent pain for 2 weeks and leukocytosis that may be due to obstructing ureteral stone, UGI bleed or right foot infection -Will proceed to OR for cysto, right stent placement vs right ureteroscopy and basket extraction of stone if stone seen at [facility].  -The risks, benefits and alternatives of cystoscopy with right JJ stent placement was discussed with the patient.  Risks include, but are not limited to: bleeding, urinary tract infection, ureteral injury, ureteral stricture disease, chronic pain, urinary symptoms, bladder injury, stent migration, the need for nephrostomy tube placement, MI, CVA, DVT, PE and the inherent risks with general anesthesia.  The patient voices understanding and wishes to proceed.   Matt R. Ordell Prichett MD 05/07/2020, 4:17 PM  [facility]  Pager:  [phone]   CC: Quintella Reichert, MD

## 2020-05-07 NOTE — Anesthesia Postprocedure Evaluation (Signed)
Anesthesia Post Note  Patient: Hunter Bradshaw  Procedure(s) Performed: CYSTOSCOPY WITH RETROGRADE PYELOGRAM/URETERAL STENT PLACEMENT (Right Ureter)     Patient location during evaluation: PACU Anesthesia Type: General Level of consciousness: awake and alert Pain management: pain level controlled Vital Signs Assessment: post-procedure vital signs reviewed and stable Respiratory status: spontaneous breathing, nonlabored ventilation, respiratory function stable and patient connected to nasal cannula oxygen Cardiovascular status: blood pressure returned to baseline and stable Postop Assessment: no apparent nausea or vomiting Anesthetic complications: no   No complications documented.  Last Vitals:  Vitals:   05/07/20 2051 05/07/20 2104  BP: 121/74 140/76  Pulse: 100 100  Resp: 19 18  Temp: 36.8 C 36.8 C  SpO2: 100% 100%    Last Pain:  Vitals:   05/07/20 2104  TempSrc: Oral  PainSc:                  Raja Caputi DAVID

## 2020-05-07 NOTE — Progress Notes (Signed)
Interim progress note  Late entry, visited patient in his room after removal of ureteral stone.  Assessed right foot/medial malleolus wound.  Right ankle with some edema/induration, 2+ DP pulses appreciated bilaterally, neurologically and vascularly intact, however tenderness to palpation is appreciated to medial aspect of patient's right ankle, scabbing is appreciated without any active drainage, however fluctuance is appreciated around the scab to palpation without crepitus.  -Pharmacy consulted for vancomycin dosing, plan to monitor cultures and his clinical status -Can consult wound care/surgery for debridement/I&D as needed depending on how he does with IV antibiotics     Peggyann Shoals, DO [facility], PGY-3 05/07/2020 10:26 PM

## 2020-05-07 NOTE — Hospital Course (Addendum)
Hunter Bradshaw is a 44 y.o. male with pmhx of HTN, GERD, chronic back pain, arthritis and recent incarceration who presented with 3-day history of melena and an episode of syncope, found to have an actively bleeding duodenal ulcer on EGD. Below is his hospital course listed by problem. Refer to the H&P for additional information.  Melena 2/2 Actively bleeding duodenal ulcer Patient presented with hemoglobin of 9.5 which is consistent with prior hemoglobin measurements.  Patient reported multiple melenic stools prior to admission.  On the day of admission, patient states that he was standing to go to the restroom when he blacked out.  Patient was evaluated by gastroenterology for suspected GI bleed.  Patient had elevated white blood cell count of 19 and BUN elevated at 38.  He was suspected to have upper GI bleed.  He was treated with IV Protonix and scheduled for EGD on 12/27 which showed actively bleeding duodenal ulcer that was treated with epi/BiCAP/endoscopic clipping. Received *** total U of PRBC. Hgb stable on d/c at ***.   Hepatitis C Found to be positive, likely from IVDU. Viral load and genotype ***.   Right ureteral stone Abdominal CT was completed in order to further evaluate for active GI bleed.  Imaging was not notable for active bleeding source however did note right ureter stone with hydronephrosis.  Patient did report some back pain in addition to abdominal pain.  He was treated with morphine IV and subsequently transition to oral pain management with scheduled Tylenol and as needed oxycodone***.  Urology evaluated the patient in he under went cystoscopy with right stent placement on 05/08/20. He will need outpatient follow up for definitive treatment of left ureteral stone   Syncope Patient reported to have syncopal episode prior to being transported to ED.  Patient has no significant cardiac history of arrhythmia or CVA.  Believe that patient syncope could be related to decreased  intravascular volume due to bleeding as mentioned above. Patient was monitored on cardiac monitors with no dire abnormalities during this admission/SR  Right Calcaneal Wound At the time of admission, patient was being treated with doxycycline for a right foot wound after calcaneal fracture.  Patient was started on vancomycin (12/26-12/27) but this was d/c once MRI did not show any evidence of osteomyelitis or abscess. MRI showed intact hardware that was unchanged from prior imaging, fracture appeared incompletely healed.     All other chronic conditions stable.   Follow Up: Hepatitis C positive. Will need outpatient follow up with GI for treatment. Will need repeat hepatitis viral load in 6 months.  Encourage smoking cessation as well as provide resources for further drug use cessation Will need repeat EGD in 3 months. Protonix 40 mg po bid for 4 weeks, then drop to 40 mg daily indefinitely.    ***Amoxicillin 1 g twice daily clindamycin 500 mg twice daily, Flagyl 500 mg twice daily for H. pylori Urology: s/p stent needs outpatient follow up *** 6. Repeat CBC to monitor thrombocytopenia after finished treatment for H. Pylori. 7. Recommend outpatient sleep study due to hypoxia overnight 8. Avoidance of NSAIDs or aspirin due to gastric ulcer. 9. Encourage avoidance of alcohol.

## 2020-05-07 NOTE — H&P (Addendum)
Family Medicine Teaching [facility] Admission History and Physical Service Pager: [phone]  Patient name: Hunter Bradshaw Medical record number: [id number] Date of birth: 24-Aug-1975 Age: 44 y.o. Gender: male  Primary Care Provider: Juliette Alcide, MD @ [facility] Medicine Consultants: GI, Uro Code Status: Full Code  Preferred Emergency Contact:  Rachelle Hora, girlfriend, [phone] or [phone] (pt cell #)  Chief Complaint: dizziness, syncope and abdominal pain  Assessment and Plan: Hunter Bradshaw is a 44 y.o. male presenting with melena, abdominal pain and sycope. PMH is significant for GERD, L4 -S4 fusion, HTN  Concern for GI bleed  Patient reports 3 days dark loose stools with 1 episode weakness and syncope this morning. Denies fever.  Paternal grandfather with hx of colon cancer. No family history of bleeding disorders. Denies getting an endoscopy or colonoscopy in the past. FOBT positive. No hematuria. Hgb 9.5 WBC 19. Lactic acid 3.1, BUN 38.  On exam, abdomen tender to palpation around umbilicus. CT abd/pelvis does not show active bleeding. Patient does not appear to have significant risk factors for GI bleed. Denies NSAID use, alcohol use, chronic vomiting. Consider diagnoses of peptic ulcer with the melena, elevated BUN. GI consulted. Upper endoscopy to be performed tomorrow.  - admit to progressive, attending Dr. Miquel Dunn - GI following, appreciate recommendations  - npo at midnight for upper endoscopy in the morning - vitals per floor  - protonix gtt for 72 hours followed by IV protonix 40 - Trend hgb with next check in pm - am CMP, CBC with dif  - morphine 2mg  q 3 hrs prn for pain management  - mIVF 144 ml/h - f/u blood and urine cultures   Concern for syncopal episode Seems to be positional, as patient experienced syncopal episode when getting up and going to the restroom, and reports dizziness when going from lying to sitting upright. Could also be due to  dehydration as UA with specific gravity of 1.046. Lactic acid 3.1. Also considered due to blood loss, though hgb 9.9 .  - Orthostatic vitals  - bed rest with precautions  - cardiac monitoring   Ureteral Stone Patient with chronic back pain but did not endorse significant flank pain or different back pain on admission. CTA shows mild right hydroureteronephrosis secondary to a 3 mm stone in the distal right ureter and a tiny right renal stone. Leukocytosis possibly due to obstructing ureteral stone. UA without signs of urinary tract infection.  - Urology following, appreciate recs  - OR for cystoscopy and R stent placement vs R ureteroscopy and basket extraction of stone  - morphine 2mg  IV q 3 prn.   Foot Wound  X ray of R ankle shows prior calcaneal fracture with fixation. Generalized soft tissue swelling is seen with no focal abnormalities. Patient reports purulent drainage in past week, has been taking Doxycyline BID for the past 5 days. Potential source of infection, as WBC is 19 and patient experienced elevated temp 2 weeks ago (Tmax 100) prior to incarceration but has not had his temp checked since.    Nodular Liver  CTA abdomen and pelvis showed nodular contour and concerning for cirrhosis. Patient denies any history of alcohol use.  - AM CMP  Chronic back pain Patient with history of surgical fusion at L4 through S1 - morphine 2mg  q 3 prn as listed above   Tobacco use  Reports 1/2 pack per day for 20 years - Nicotine patch 14mg     FEN/GI: NPO  Prophylaxis: SCDs due  to active bleeding   Disposition: progressive  History of Present Illness:  Hunter Bradshaw is a 44 y.o. male presenting with menlena and syncope.   Patient reports 3 days rectal bleeding and weakness. Has noticed black stools for the past 3 days. This morning he was trying to stand for the bathroom and felt lightheaded and blacked out. He denies hx of black stools. He endorses abdominal pain that moves up to his  chest that is new. He reports chronic back pain after L4-L5 fusion surgery, no new pain just reports that it feels dull. He denies dysuria. He denies any increased urinary frequency. He reports loose stool for the last few days. He reports having a fever of 99.1 two weeks ago before being incarcerated. He states that he has chronic elevated BP. 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  He reports that he cannot walk on those due to pain/swelling. Reports paternal grandfather with hx of colon cancer. He has not had colonoscopy. He denies taking NSAIDs, ASA, or goody powders. Patient frequently asking for pain medication due to intense pain in abdomen and back.   Reports having pain medication problem and has been off of opiods for about 1 year.   Denies allergies to medications. No Known allergies.   Over last 5 days has been taking Doxycycline BID for R foot infection   Review Of Systems: Per HPI   Review of Systems  Constitutional: Positive for fatigue.  Gastrointestinal: Positive for abdominal pain and blood in stool.  Musculoskeletal: Positive for back pain.  Skin: Positive for color change.  Neurological: Positive for dizziness, weakness and light-headedness.     Patient Active Problem List   Diagnosis Date Noted   Upper GI bleed    Abdominal pain    Melena    Open calcaneal fracture 12/01/2019    Past Medical History: Past Medical History:  Diagnosis Date   Arthritis    Chronic back pain    GERD (gastroesophageal reflux disease)    Hypertension    Sciatica     Past Surgical History: Past Surgical History:  Procedure Laterality Date   ANTERIOR CERVICAL DECOMP/DISCECTOMY FUSION     BACK SURGERY     4 prior back surgeries   EXTERNAL FIXATION LEG Right 12/01/2019   Procedure:  EXTERNAL FIXATION LEG;  Surgeon: Cammy Copa, MD;  Location: [facility] OR;  Service: Orthopedics;  Laterality: Right;   EXTERNAL FIXATION REMOVAL Right 12/03/2019   Procedure: REMOVAL EXTERNAL FIXATION LEG;  Surgeon: Myrene Galas, MD;  Location: [facility] OR;  Service: Orthopedics;  Laterality: Right;   I & D EXTREMITY Bilateral 12/01/2019   Procedure: IRRIGATION AND DEBRIDEMENT OF LEG;  Surgeon: Cammy Copa, MD;  Location: [facility] OR;  Service: Orthopedics;  Laterality: Bilateral;   I & D EXTREMITY Right 12/03/2019   Procedure: IRRIGATION AND DEBRIDEMENT OF open ankle fracture;  Surgeon: Myrene Galas, MD;  Location: [facility] OR;  Service: Orthopedics;  Laterality: Right;   KNEE SURGERY     bilateral ACL repair   NECK SURGERY     ORIF CALCANEOUS FRACTURE Right 12/03/2019   Procedure: OPEN REDUCTION INTERNAL FIXATION (ORIF) CALCANEOUS FRACTURE;  Surgeon: Myrene Galas, MD;  Location: [facility] OR;  Service: Orthopedics;  Laterality: Right;   SHOULDER ARTHROSCOPY     Left  Social History: Social History   Tobacco Use   Smoking status: Current Every Day Smoker    Packs/day: 0.50    Years: 23.00    Pack years: 11.50  Substance Use Topics   Alcohol use: Yes    Comment: liqour today   Drug use: Yes    Types: Marijuana    Comment: today   Additional social history:  Please also refer to relevant sections of EMR.  Family History: No family history on file. Paternal grandfather- colon cancer   Allergies and Medications: No Known Allergies No current facility-administered medications on file prior to encounter.   Current Outpatient Medications on File Prior to Encounter  Medication Sig Dispense Refill   acetaminophen (TYLENOL) 500 MG tablet Take 500 mg by mouth every 6 (six) hours as needed for moderate pain.     cyclobenzaprine (FLEXERIL) 5 MG tablet Take 1 tablet (5 mg total) by mouth 3 (three) times daily as needed. (Patient not taking: No sig reported) 30 tablet 0   naproxen  (NAPROSYN) 500 MG tablet Take 1 tablet (500 mg total) by mouth 2 (two) times daily with a meal. (Patient not taking: No sig reported) 30 tablet 0   oxyCODONE-acetaminophen (PERCOCET/ROXICET) 5-325 MG per tablet Take 1 tablet by mouth every 4 (four) hours as needed. (Patient not taking: No sig reported) 24 tablet 0    Objective: BP 110/82    Pulse (!) 104    Temp 98.2 F (36.8 C)    Resp (!) 22    SpO2 100%   Exam: General: pale male, pleasant, nontoxic appearing Eyes: PERRLA. EOMI  ENTM: MMM. Poor dentition with majority of teeth missing. No visual lesions Neck: supple Cardiovascular: Tachycardic. RR. no murmurs Respiratory: CTAB. Normal WOB Gastrointestinal: Soft, non-distended. Normoactive BS. TTP around umbilicus and right flank MSK: R medial ankle erythematous and edematous, healing with no drainage. Shackles on ankles  Derm: skin warm, dry Neuro: No focal deficits  Psych: alert and oriented. Normal affect  Labs and Imaging: CBC BMET  Recent Labs  Lab 05/07/20 1146 05/07/20 1154  WBC 19.0*  --   HGB 9.5* 9.9*  HCT 30.8* 29.0*  PLT 250  --    Recent Labs  Lab 05/07/20 1146 05/07/20 1154  NA 136 139  K 4.0 4.1  CL 106 104  CO2 20*  --   BUN 38* 38*  CREATININE 0.92 0.70  GLUCOSE 159* 150*  CALCIUM 8.6*  --      EKG: Sinus   DG Chest Port 1 View  Result Date: 05/07/2020 CLINICAL DATA:  Shortness of breath, lateral RIGHT ankle wound with drainage EXAM: PORTABLE CHEST 1 VIEW COMPARISON:  Portable exam 1244 hours compared to 12/05/2019 FINDINGS: Normal heart size, mediastinal contours, and pulmonary vascularity. Lungs clear. No infiltrate, pleural effusion, or pneumothorax. Bones demineralized. IMPRESSION: No acute abnormalities. Electronically Signed   By: Ulyses SouthwardMark  Boles M.D.   On: 05/07/2020 12:52   DG Ankle Right Port  Result Date: 05/07/2020 CLINICAL DATA:  Ankle wound with drainage, initial encounter EXAM: PORTABLE RIGHT ANKLE - 2 VIEW COMPARISON:  12/03/2019  FINDINGS: Multiple fixation screws are noted traversing the calcaneus and medial malleolus. Significant soft tissue swelling is noted. Previously seen calcaneal fracture is again identified and stable. Loss of the plantar arch is noted as well. No new focal abnormality is seen. IMPRESSION: Prior calcaneal fracture with fixation. Generalized soft tissue swelling is seen. No other focal abnormality is noted. Electronically Signed   By: Alcide CleverMark  Lukens  M.D.   On: 05/07/2020 12:59   CT Angio Abd/Pel W and/or Wo Contrast  Result Date: 05/07/2020 CLINICAL DATA:  44 year old with rectal bleeding. EXAM: CTA ABDOMEN AND PELVIS WITHOUT AND WITH CONTRAST TECHNIQUE: Multidetector CT imaging of the abdomen and pelvis was performed using the standard protocol during bolus administration of intravenous contrast. Multiplanar reconstructed images and MIPs were obtained and reviewed to evaluate the vascular anatomy. CONTRAST:  OMNIPAQUE IOHEXOL 350 MG/ML SOLN COMPARISON:  CT chest, abdomen and pelvis 11/01/2019 FINDINGS: VASCULAR Aorta: Mild atherosclerotic disease in the distal abdominal aorta without aneurysm or dissection. No aortic stenosis. Celiac: Patent without evidence of aneurysm, dissection, vasculitis or significant stenosis. SMA: Patent without evidence of aneurysm, dissection, vasculitis or significant stenosis. Replaced right hepatic artery. Renals: Left renal artery is widely patent without aneurysm or dissection. Nance amount of atherosclerotic disease involving the main right renal artery without significant stenosis. Ponciano accessory right renal artery. IMA: Patent Inflow: Mild atherosclerotic disease involving the iliac arteries. Common and external iliac arteries are patent bilaterally without significant stenosis. Left internal iliac artery is patent. Right internal iliac artery is patent but there may be atherosclerotic disease involving branch vessels. Proximal Outflow: Proximal femoral arteries are  patent bilaterally. Veins: Portal venous system is patent. Normal appearance of the IVC and renal veins. Iliac veins are unremarkable. Review of the MIP images confirms the above findings. NON-VASCULAR Lower chest: Lung bases are clear. Hepatobiliary: Liver has a nodular contour, particularly along the inferior right hepatic lobe. Findings are concerning for cirrhosis. No acute abnormality to the gallbladder. No biliary dilatation. Pancreas: Unremarkable. No pancreatic ductal dilatation or surrounding inflammatory changes. Spleen: Normal in size without focal abnormality. Adrenals/Urinary Tract: Normal appearance of both adrenal glands. Normal appearance of the left kidney without stones, renal lesion or hydronephrosis. Mild dilatation of the right renal pelvis and right ureter with a new 3 mm stone in the distal right ureter just proximal to the right ureterovesical junction. In addition, there is a 2 mm stone in the mid right kidney. Urinary bladder is unremarkable. Stomach/Bowel: Stomach is within normal limits. Appendix appears normal. No evidence of bowel wall thickening, distention, or inflammatory changes. No evidence for active GI bleeding. Lymphatic: Melcher lymph nodes in the gastrohepatic ligament. Precaval lymph node measures 1.4 cm in short axis and stable. Right external iliac lymph node measures 7 mm in short axis on sequence 10, image 161 and minimally changed from the prior examination. Kortz lymph nodes along the anterior pelvic sidewalls and inguinal regions are unchanged from the prior examination. Reproductive: Prostate is unremarkable. Other: Negative for free fluid. Negative for free air. Shavers umbilical hernia containing fat. Musculoskeletal: Old fractures involving the left transverse process at L1, L2 and L3. Again noted is pedicle screw and bilateral rod fixation at L4, L5 and S1 with interbody devices. Old fractures of the right tenth, ninth, eighth and seventh ribs. IMPRESSION: VASCULAR 1.  No evidence for active GI bleeding. 2. Mild atherosclerotic disease in the aorta and iliac arteries without significant stenosis. Main visceral arteries are patent. NON-VASCULAR 1. Mild right hydroureteronephrosis secondary to a 3 mm stone in the distal right ureter. Additional tiny right kidney stone. 2. Old fractures involving right ribs and lumbar left transverse processes. Stable appearance of the surgical fusion at L4 through S1. 3. Liver has a nodular contour and concerning for cirrhosis. Electronically Signed   By: Richarda Overlie M.D.   On: 05/07/2020 14:16    Cora Collum, DO 05/07/2020, 3:24  PM PGY-1, [facility] Family Medicine FPTS Intern pager: [phone], text pages welcome

## 2020-05-07 NOTE — Transfer of Care (Signed)
Immediate Anesthesia Transfer of Care Note  Patient: Hunter Bradshaw  Procedure(s) Performed: CYSTOSCOPY WITH RETROGRADE PYELOGRAM/URETERAL STENT PLACEMENT POSSIBLE URETEROSCOPY, POSSIBLE STONE REMOVAL (Right )  Patient Location: PACU  Anesthesia Type:General  Level of Consciousness: awake and alert   Airway & Oxygen Therapy: Patient Spontanous Breathing  Post-op Assessment: Report given to RN and Post -op Vital signs reviewed and stable  Post vital signs: Reviewed and stable  Last Vitals:  Vitals Value Taken Time  BP 121/80 05/07/20 1837  Temp 36.4 C 05/07/20 1837  Pulse 109 05/07/20 1838  Resp 14 05/07/20 1838  SpO2 100 % 05/07/20 1838  Vitals shown include unvalidated device data.  Last Pain: There were no vitals filed for this visit.       Complications: No complications documented.

## 2020-05-07 NOTE — Interval H&P Note (Signed)
History and Physical Interval Note:  05/07/2020 6:16 PM  Hunter Bradshaw  has presented today for surgery, with the diagnosis of Ureteral Stone .  The various methods of treatment have been discussed with the patient and family. After consideration of risks, benefits and other options for treatment, the patient has consented to  Procedure(s): CYSTOSCOPY WITH RETROGRADE PYELOGRAM/URETERAL STENT PLACEMENT POSSIBLE URETEROSCOPY, POSSIBLE STONE REMOVAL (Right) as a surgical intervention.  The patient's history has been reviewed, patient examined, no change in status, stable for surgery.  I have reviewed the patient's chart and labs.  Questions were answered to the patient's satisfaction.     Jannifer Hick

## 2020-05-08 ENCOUNTER — Inpatient Hospital Stay (HOSPITAL_COMMUNITY): Payer: Medicaid Other

## 2020-05-08 ENCOUNTER — Other Ambulatory Visit: Payer: Self-pay | Admitting: Physician Assistant

## 2020-05-08 ENCOUNTER — Encounter (HOSPITAL_COMMUNITY): Payer: Self-pay | Admitting: Urology

## 2020-05-08 ENCOUNTER — Inpatient Hospital Stay (HOSPITAL_COMMUNITY): Payer: Medicaid Other | Admitting: Certified Registered"

## 2020-05-08 ENCOUNTER — Encounter (HOSPITAL_COMMUNITY): Admission: EM | Payer: Self-pay | Attending: Family Medicine

## 2020-05-08 DIAGNOSIS — K264 Chronic or unspecified duodenal ulcer with hemorrhage: Secondary | ICD-10-CM | POA: Diagnosis not present

## 2020-05-08 DIAGNOSIS — K2970 Gastritis, unspecified, without bleeding: Secondary | ICD-10-CM | POA: Diagnosis not present

## 2020-05-08 DIAGNOSIS — M25571 Pain in right ankle and joints of right foot: Secondary | ICD-10-CM

## 2020-05-08 HISTORY — PX: HEMOSTASIS CLIP PLACEMENT: SHX6857

## 2020-05-08 HISTORY — PX: HOT HEMOSTASIS: SHX5433

## 2020-05-08 HISTORY — PX: SUBMUCOSAL INJECTION: SHX5543

## 2020-05-08 HISTORY — PX: BIOPSY: SHX5522

## 2020-05-08 HISTORY — PX: ESOPHAGOGASTRODUODENOSCOPY (EGD) WITH PROPOFOL: SHX5813

## 2020-05-08 LAB — CBC WITH DIFFERENTIAL/PLATELET
Abs Immature Granulocytes: 0.09 10*3/uL — ABNORMAL HIGH (ref 0.00–0.07)
Basophils Absolute: 0.1 10*3/uL (ref 0.0–0.1)
Basophils Relative: 0 %
Eosinophils Absolute: 0.2 10*3/uL (ref 0.0–0.5)
Eosinophils Relative: 1 %
HCT: 18.2 % — ABNORMAL LOW (ref 39.0–52.0)
Hemoglobin: 6.1 g/dL — CL (ref 13.0–17.0)
Immature Granulocytes: 1 %
Lymphocytes Relative: 34 %
Lymphs Abs: 5.4 10*3/uL — ABNORMAL HIGH (ref 0.7–4.0)
MCH: 31.1 pg (ref 26.0–34.0)
MCHC: 33.5 g/dL (ref 30.0–36.0)
MCV: 92.9 fL (ref 80.0–100.0)
Monocytes Absolute: 0.7 10*3/uL (ref 0.1–1.0)
Monocytes Relative: 4 %
Neutro Abs: 9.6 10*3/uL — ABNORMAL HIGH (ref 1.7–7.7)
Neutrophils Relative %: 60 %
Platelets: 164 10*3/uL (ref 150–400)
RBC: 1.96 MIL/uL — ABNORMAL LOW (ref 4.22–5.81)
RDW: 16.2 % — ABNORMAL HIGH (ref 11.5–15.5)
WBC: 15.9 10*3/uL — ABNORMAL HIGH (ref 4.0–10.5)
nRBC: 0 % (ref 0.0–0.2)

## 2020-05-08 LAB — COMPREHENSIVE METABOLIC PANEL
ALT: 65 U/L — ABNORMAL HIGH (ref 0–44)
AST: 44 U/L — ABNORMAL HIGH (ref 15–41)
Albumin: 2.5 g/dL — ABNORMAL LOW (ref 3.5–5.0)
Alkaline Phosphatase: 70 U/L (ref 38–126)
Anion gap: 9 (ref 5–15)
BUN: 19 mg/dL (ref 6–20)
CO2: 20 mmol/L — ABNORMAL LOW (ref 22–32)
Calcium: 7.7 mg/dL — ABNORMAL LOW (ref 8.9–10.3)
Chloride: 109 mmol/L (ref 98–111)
Creatinine, Ser: 0.85 mg/dL (ref 0.61–1.24)
GFR, Estimated: >60 mL/min (ref >60)
Glucose, Bld: 92 mg/dL (ref 70–99)
Potassium: 3.6 mmol/L (ref 3.5–5.1)
Sodium: 138 mmol/L (ref 135–145)
Total Bilirubin: 0.6 mg/dL (ref 0.3–1.2)
Total Protein: 5.7 g/dL — ABNORMAL LOW (ref 6.5–8.1)

## 2020-05-08 LAB — PROTIME-INR
INR: 1.2 (ref 0.8–1.2)
Prothrombin Time: 14.7 seconds (ref 11.4–15.2)

## 2020-05-08 LAB — HEPATITIS B SURFACE ANTIGEN: Hepatitis B Surface Ag: NONREACTIVE

## 2020-05-08 LAB — PATHOLOGIST SMEAR REVIEW

## 2020-05-08 LAB — URINE CULTURE: Culture: <10000 — AB

## 2020-05-08 LAB — HEMOGLOBIN A1C
Hgb A1c MFr Bld: 4.9 % (ref 4.8–5.6)
Mean Plasma Glucose: 93.93 mg/dL

## 2020-05-08 LAB — HEMOGLOBIN AND HEMATOCRIT, BLOOD
HCT: 23.8 % — ABNORMAL LOW (ref 39.0–52.0)
Hemoglobin: 8.1 g/dL — ABNORMAL LOW (ref 13.0–17.0)

## 2020-05-08 LAB — PREPARE RBC (CROSSMATCH)

## 2020-05-08 LAB — HEPATITIS C ANTIBODY: HCV Ab: REACTIVE — AB

## 2020-05-08 LAB — HIV ANTIBODY (ROUTINE TESTING W REFLEX): HIV Screen 4th Generation wRfx: NONREACTIVE

## 2020-05-08 LAB — HEPATITIS B CORE ANTIBODY, TOTAL: Hep B Core Total Ab: NONREACTIVE

## 2020-05-08 SURGERY — ESOPHAGOGASTRODUODENOSCOPY (EGD) WITH PROPOFOL
Anesthesia: Monitor Anesthesia Care

## 2020-05-08 MED ORDER — SODIUM CHLORIDE 0.9% IV SOLUTION
Freq: Once | INTRAVENOUS | Status: AC
  Administered 2020-05-08: 09:00:00 10 mL/h via INTRAVENOUS

## 2020-05-08 MED ORDER — GADOBUTROL 1 MMOL/ML IV SOLN
10.0000 mL | Freq: Once | INTRAVENOUS | Status: AC | PRN
  Administered 2020-05-08: 16:00:00 10 mL via INTRAVENOUS

## 2020-05-08 MED ORDER — ONDANSETRON HCL 4 MG/2ML IJ SOLN
INTRAMUSCULAR | Status: DC | PRN
  Administered 2020-05-08: 4 mg via INTRAVENOUS

## 2020-05-08 MED ORDER — PREGABALIN 100 MG PO CAPS
200.0000 mg | ORAL_CAPSULE | Freq: Every evening | ORAL | Status: DC
  Administered 2020-05-08 – 2020-05-10 (×3): 200 mg via ORAL
  Filled 2020-05-08 (×3): qty 2

## 2020-05-08 MED ORDER — SODIUM CHLORIDE (PF) 0.9 % IJ SOLN
PREFILLED_SYRINGE | INTRAMUSCULAR | Status: DC | PRN
  Administered 2020-05-08: 11:00:00 3 mL

## 2020-05-08 MED ORDER — ACETAMINOPHEN 10 MG/ML IV SOLN
1000.0000 mg | Freq: Four times a day (QID) | INTRAVENOUS | Status: AC
  Administered 2020-05-08 – 2020-05-09 (×4): 1000 mg via INTRAVENOUS
  Filled 2020-05-08 (×4): qty 100

## 2020-05-08 MED ORDER — LIDOCAINE 2% (20 MG/ML) 5 ML SYRINGE
INTRAMUSCULAR | Status: DC | PRN
  Administered 2020-05-08: 80 mg via INTRAVENOUS

## 2020-05-08 MED ORDER — SODIUM CHLORIDE 0.9 % IV SOLN
8.0000 mg/h | INTRAVENOUS | Status: AC
  Administered 2020-05-08 – 2020-05-11 (×6): 8 mg/h via INTRAVENOUS
  Filled 2020-05-08 (×9): qty 80

## 2020-05-08 MED ORDER — PROPOFOL 10 MG/ML IV BOLUS
INTRAVENOUS | Status: DC | PRN
  Administered 2020-05-08: 30 mg via INTRAVENOUS
  Administered 2020-05-08: 80 mg via INTRAVENOUS
  Administered 2020-05-08: 20 mg via INTRAVENOUS

## 2020-05-08 MED ORDER — LACTATED RINGERS IV SOLN
INTRAVENOUS | Status: AC | PRN
  Administered 2020-05-08: 20 mL/h via INTRAVENOUS

## 2020-05-08 MED ORDER — EPINEPHRINE 1 MG/10ML IJ SOSY
PREFILLED_SYRINGE | INTRAMUSCULAR | Status: AC
  Filled 2020-05-08: qty 10

## 2020-05-08 MED ORDER — SODIUM CHLORIDE 0.9 % IV SOLN
80.0000 mg | Freq: Once | INTRAVENOUS | Status: AC
  Administered 2020-05-08: 14:00:00 80 mg via INTRAVENOUS
  Filled 2020-05-08: qty 80

## 2020-05-08 MED ORDER — MORPHINE SULFATE (PF) 4 MG/ML IV SOLN
4.0000 mg | INTRAVENOUS | Status: DC | PRN
Start: 2020-05-08 — End: 2020-05-09
  Administered 2020-05-08 – 2020-05-09 (×6): 4 mg via INTRAVENOUS
  Filled 2020-05-08 (×6): qty 1

## 2020-05-08 MED ORDER — PROPOFOL 500 MG/50ML IV EMUL
INTRAVENOUS | Status: DC | PRN
  Administered 2020-05-08: 100 ug/kg/min via INTRAVENOUS

## 2020-05-08 SURGICAL SUPPLY — 15 items

## 2020-05-08 NOTE — Progress Notes (Signed)
Nurse called to notify physician team the AM Hgb returned at 6.1. Type and screen completed yesterday. Ordered one unit of blood for transfusion this morning. RN to order post H&H at appropriate interval after transfusion has finished.   Fayette Pho, MD

## 2020-05-08 NOTE — Progress Notes (Addendum)
Interim Progress Note  In to check on patient after EGD. Feels well. Only complaint is that he continues to have restless legs, feels that this is not allowing him to get some rest. States that in the past he has had gabapentin and pregabalin for this.   Upon chart review, appears that patient did have pregabalin back in 2015- 200mg  TID. Will add on 200 mg nightly. Also advised RN to place SCD's on patients legs for VTE ppx as he cannot have Lovenox at this time due to GIB.    Addendum 1845: MRI finding without evidence of osteo or abscess. Findings suggest incompletely healed fracture. Will d/c vancomycin at this time.

## 2020-05-08 NOTE — Op Note (Addendum)
[facility] Name: Hunter BarbRodney Goshert Procedure Date : 05/08/2020 MRN: [id number] Attending MD: Lynann Bolognaajesh Raine Blodgett , MD Date of Birth: 04/12/1976 CSN: [id number] Age: 44 Admit Type: Inpatient Procedure:                Upper GI endoscopy Indications:              Melena. Newly diagnosed liver cirrhosis on CT this                            Adm. No definite portal hypertension. Providers:                Lynann Bolognaajesh Billijo Dilling, MD, Fayrene FearingLaura Turner, RN, Arlee Muslimhris Chandler                            Tech., Technician, Dairl PonderFudan Jiang, CRNA Referring MD:              Medicines:                Monitored Anesthesia Care Complications:            No immediate complications. Estimated Blood Loss:     Estimated blood loss: none. Procedure:                Pre-Anesthesia Assessment:                           - Prior to the procedure, a History and Physical                            was performed, and patient medications and                            allergies were reviewed. The patient's tolerance of                            previous anesthesia was also reviewed. The risks                            and benefits of the procedure and the sedation                            options and risks were discussed with the patient.                            All questions were answered, and informed consent                            was obtained. Prior Anticoagulants: The patient has                            taken no previous anticoagulant or antiplatelet                            agents. ASA Grade Assessment: III - A patient with  severe systemic disease. After reviewing the risks                            and benefits, the patient was deemed in                            satisfactory condition to undergo the procedure.                           After obtaining informed consent, the endoscope was                            passed under direct vision. Throughout the                             procedure, the patient's blood pressure, pulse, and                            oxygen saturations were monitored continuously. The                            GIF-H190 ([id number]) Olympus gastroscope was                            introduced through the mouth, and advanced to the                            second part of duodenum. The upper GI endoscopy was                            accomplished without difficulty. The patient                            tolerated the procedure well. Scope In: Scope Out: Findings:      The examined esophagus was normal without esophageal varices. Z-line was       mildly irregular at 37 cm.      A 2 cm hiatal hernia was present.      Diffuse mild inflammation characterized by erythema was found in the       gastric body and in the gastric antrum. 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 2       Biopsies were carefully taken with a cold forceps for eval for HP.      One oozing posterior cratered duodenal ulcer was found in the first       portion of the duodenum. The lesion was 8 mm in largest dimension. Area       was successfully injected with 5 mL of a 1:10,000 solution of       epinephrine for hemostasis. Coagulation for hemostasis using bipolar       probe was unsuccessful. For hemostasis, three hemostatic clips were       successfully placed (MR conditional). There was no bleeding at the end       of the procedure. Impression:               - Actively bleeding duodenal ulcer s/p epi  injection/BiCAP/endoscopic clipping.                           - 2 cm hiatal hernia.                           - Gastritis. Biopsied. Recommendation:           - Return patient to hospital ward for ongoing care.                           - Clear liquid diet. Advance to full liquids in AM.                           - Give Protonix (pantoprazole): 8 mg/hr IV by                            continuous infusion for 72 hours. Thereafter                             transition to 40 mg p.o. twice daily x 4 weeks,                            then 40 mg p.o. once a day indefinitely.                           - Trend CBC. Check Hb/Hct Q8 hrs x 3. Transfuse as                            needed.                           - If there is any active brisk bleeding with HD                            instability, would recommend IR embolization.                           - Avoid all nonsteroidals for now.                           - Follow biopsies.                           - The findings and recommendations were discussed                            with the patient.                           - His hepatitis C antibody was positive. I have                            ordered hepatitis C viral load and genotype.                           -  We will follow along. Procedure Code(s):        --- Professional ---                           [phone], 59, Esophagogastroduodenoscopy, flexible,                            transoral; with control of bleeding, any method                           43239, Esophagogastroduodenoscopy, flexible,                            transoral; with biopsy, single or multiple Diagnosis Code(s):        --- Professional ---                           K44.9, Diaphragmatic hernia without obstruction or                            gangrene                           K29.70, Gastritis, unspecified, without bleeding                           K26.4, Chronic or unspecified duodenal ulcer with                            hemorrhage                           K92.1, Melena (includes Hematochezia) CPT copyright 2019 American Medical Association. All rights reserved. The codes documented in this report are preliminary and upon coder review may  be revised to meet current compliance requirements. Lynann Bologna, MD 05/08/2020 11:23:59 AM This report has been signed electronically. Number of Addenda: 0

## 2020-05-08 NOTE — Transfer of Care (Signed)
Immediate Anesthesia Transfer of Care Note  Patient: Hunter Bradshaw  Procedure(s) Performed: ESOPHAGOGASTRODUODENOSCOPY (EGD) WITH PROPOFOL (N/A )  Patient Location: PACU  Anesthesia Type:MAC  Level of Consciousness: awake, alert , oriented and patient cooperative  Airway & Oxygen Therapy: Patient Spontanous Breathing and Patient connected to nasal cannula oxygen  Post-op Assessment: Report given to RN and Post -op Vital signs reviewed and stable  Post vital signs: Reviewed and stable  Last Vitals:  Vitals Value Taken Time  BP 146/72 05/08/20 1119  Temp 36.7 C 05/08/20 1119  Pulse 82 05/08/20 1119  Resp 19 05/08/20 1119  SpO2 100 % 05/08/20 1119  Vitals shown include unvalidated device data.  Last Pain:  Vitals:   05/08/20 1119  TempSrc: Temporal  PainSc: 0-No pain         Complications: No complications documented.

## 2020-05-08 NOTE — Progress Notes (Signed)
1 Day Post-Op Subjective: Pain controlled. No nausea or emesis.  Objective: Vital signs in last 24 hours: Temp:  [97.6 F (36.4 C)-98.3 F (36.8 C)] 98.1 F (36.7 C) (12/27 0744) Pulse Rate:  [90-120] 97 (12/27 0744) Resp:  [15-22] 16 (12/27 0744) BP: (100-140)/(53-85) 124/74 (12/27 0744) SpO2:  [96 %-100 %] 100 % (12/27 0744) Weight:  [111.1 kg] 111.1 kg (12/26 1732)  Intake/Output from previous day: 12/26 0701 - 12/27 0700 In: 781.9 [P.O.:120; I.V.:550; IV Piggyback:111.9] Out: 955 [Urine:950; Blood:5] Intake/Output this shift: Total I/O In: -  Out: 700 [Urine:700]  Physical Exam:  General: Alert and oriented CV: RRR Lungs: Clear Abdomen: Soft, ND, NT Ext: NT, No erythema  Lab Results: Recent Labs    05/07/20 1154 05/07/20 2224 05/08/20 0558  HGB 9.9* 8.8* 6.1*  HCT 29.0* 26.4* 18.2*   BMET Recent Labs    05/07/20 1146 05/07/20 1154  NA 136 139  K 4.0 4.1  CL 106 104  CO2 20*  --   GLUCOSE 159* 150*  BUN 38* 38*  CREATININE 0.92 0.70  CALCIUM 8.6*  --      Studies/Results: DG Retrograde Pyelogram  Result Date: 05/08/2020 CLINICAL DATA:  Ureteral calculus EXAM: RETROGRADE PYELOGRAM COMPARISON:  CT from earlier the same day FINDINGS: Single fluoroscopic spot image documents right ureteral catheter with opacification of the right renal collecting system. Distal ureter not visualized. Lumbosacral fixation hardware partially visualized. IMPRESSION: Retrograde right ureteral catheterization Electronically Signed   By: Corlis Leak M.D.   On: 05/08/2020 01:36   DG Chest Port 1 View  Result Date: 05/07/2020 CLINICAL DATA:  Shortness of breath, lateral RIGHT ankle wound with drainage EXAM: PORTABLE CHEST 1 VIEW COMPARISON:  Portable exam 1244 hours compared to 12/05/2019 FINDINGS: Normal heart size, mediastinal contours, and pulmonary vascularity. Lungs clear. No infiltrate, pleural effusion, or pneumothorax. Bones demineralized. IMPRESSION: No acute  abnormalities. Electronically Signed   By: Ulyses Southward M.D.   On: 05/07/2020 12:52   DG Ankle Right Port  Result Date: 05/07/2020 CLINICAL DATA:  Ankle wound with drainage, initial encounter EXAM: PORTABLE RIGHT ANKLE - 2 VIEW COMPARISON:  12/03/2019 FINDINGS: Multiple fixation screws are noted traversing the calcaneus and medial malleolus. Significant soft tissue swelling is noted. Previously seen calcaneal fracture is again identified and stable. Loss of the plantar arch is noted as well. No new focal abnormality is seen. IMPRESSION: Prior calcaneal fracture with fixation. Generalized soft tissue swelling is seen. No other focal abnormality is noted. Electronically Signed   By: Alcide Clever M.D.   On: 05/07/2020 12:59   CT Angio Abd/Pel W and/or Wo Contrast  Result Date: 05/07/2020 CLINICAL DATA:  44 year old with rectal bleeding. EXAM: CTA ABDOMEN AND PELVIS WITHOUT AND WITH CONTRAST TECHNIQUE: Multidetector CT imaging of the abdomen and pelvis was performed using the standard protocol during bolus administration of intravenous contrast. Multiplanar reconstructed images and MIPs were obtained and reviewed to evaluate the vascular anatomy. CONTRAST:  OMNIPAQUE IOHEXOL 350 MG/ML SOLN COMPARISON:  CT chest, abdomen and pelvis 11/01/2019 FINDINGS: VASCULAR Aorta: Mild atherosclerotic disease in the distal abdominal aorta without aneurysm or dissection. No aortic stenosis. Celiac: Patent without evidence of aneurysm, dissection, vasculitis or significant stenosis. SMA: Patent without evidence of aneurysm, dissection, vasculitis or significant stenosis. Replaced right hepatic artery. Renals: Left renal artery is widely patent without aneurysm or dissection. Buschman amount of atherosclerotic disease involving the main right renal artery without significant stenosis. Ketner accessory right renal artery. IMA: Patent Inflow: Mild  atherosclerotic disease involving the iliac arteries. Common and external iliac  arteries are patent bilaterally without significant stenosis. Left internal iliac artery is patent. Right internal iliac artery is patent but there may be atherosclerotic disease involving branch vessels. Proximal Outflow: Proximal femoral arteries are patent bilaterally. Veins: Portal venous system is patent. Normal appearance of the IVC and renal veins. Iliac veins are unremarkable. Review of the MIP images confirms the above findings. NON-VASCULAR Lower chest: Lung bases are clear. Hepatobiliary: Liver has a nodular contour, particularly along the inferior right hepatic lobe. Findings are concerning for cirrhosis. No acute abnormality to the gallbladder. No biliary dilatation. Pancreas: Unremarkable. No pancreatic ductal dilatation or surrounding inflammatory changes. Spleen: Normal in size without focal abnormality. Adrenals/Urinary Tract: Normal appearance of both adrenal glands. Normal appearance of the left kidney without stones, renal lesion or hydronephrosis. Mild dilatation of the right renal pelvis and right ureter with a new 3 mm stone in the distal right ureter just proximal to the right ureterovesical junction. In addition, there is a 2 mm stone in the mid right kidney. Urinary bladder is unremarkable. Stomach/Bowel: Stomach is within normal limits. Appendix appears normal. No evidence of bowel wall thickening, distention, or inflammatory changes. No evidence for active GI bleeding. Lymphatic: Heintzelman lymph nodes in the gastrohepatic ligament. Precaval lymph node measures 1.4 cm in short axis and stable. Right external iliac lymph node measures 7 mm in short axis on sequence 10, image 161 and minimally changed from the prior examination. Flammia lymph nodes along the anterior pelvic sidewalls and inguinal regions are unchanged from the prior examination. Reproductive: Prostate is unremarkable. Other: Negative for free fluid. Negative for free air. Brooking umbilical hernia containing fat. Musculoskeletal: Old  fractures involving the left transverse process at L1, L2 and L3. Again noted is pedicle screw and bilateral rod fixation at L4, L5 and S1 with interbody devices. Old fractures of the right tenth, ninth, eighth and seventh ribs. IMPRESSION: VASCULAR 1. No evidence for active GI bleeding. 2. Mild atherosclerotic disease in the aorta and iliac arteries without significant stenosis. Main visceral arteries are patent. NON-VASCULAR 1. Mild right hydroureteronephrosis secondary to a 3 mm stone in the distal right ureter. Additional tiny right kidney stone. 2. Old fractures involving right ribs and lumbar left transverse processes. Stable appearance of the surgical fusion at L4 through S1. 3. Liver has a nodular contour and concerning for cirrhosis. Electronically Signed   By: Richarda Overlie M.D.   On: 05/07/2020 14:16    Assessment/Plan: 1. Right distal ureteral stone: Measures 45mm on CTA A/P 12/26 with mild right hydro. Also present tiny right renal stone. Afebrile in ED, WBC 19, Cr 0.7, UA without sign of infection, UCx pending. S/p R stent on 05/08/2020 2.  Upper GI bleed 3. Right foot infection  -Will need f/u for definitive treatment of left ureteral stone. Messaged schedulers to arrange. -Following peripherally. Please call with questions.   LOS: 1 day   Matt R. Brinley Rosete MD 05/08/2020, 7:52 AM [facility]  Pager: [phone]

## 2020-05-08 NOTE — Progress Notes (Signed)
Pt. Arrived from PACU via stretcher. CCMD notified, VSS as well as Psychologist, prison and probation services at bedside. Orders given and carried out. See PCR for vitals

## 2020-05-08 NOTE — Anesthesia Preprocedure Evaluation (Addendum)
Anesthesia Evaluation  Patient identified by MRN, date of birth, ID band Patient awake    Reviewed: Allergy & Precautions, NPO status , Patient's Chart, lab work & pertinent test results  History of Anesthesia Complications Negative for: history of anesthetic complications  Airway Mallampati: II  TM Distance: >3 FB Neck ROM: Full    Dental  (+) Poor Dentition, Chipped, Missing, Dental Advisory Given   Pulmonary Current Smoker and Patient abstained from smoking.,  05/07/2020 SARS coronavirus NEG   breath sounds clear to auscultation       Cardiovascular hypertension (no meds),  Rhythm:Regular Rate:Normal     Neuro/Psych Chronic back pain    GI/Hepatic GERD  Controlled,(+)     substance abuse  marijuana use, Hepatitis -  Endo/Other  Morbid obesity  Renal/GU negative Renal ROS     Musculoskeletal  (+) Arthritis ,   Abdominal (+) + obese,   Peds  Hematology  (+) Blood dyscrasia (Hb 6.1), anemia ,   Anesthesia Other Findings   Reproductive/Obstetrics                            Anesthesia Physical Anesthesia Plan  ASA: III  Anesthesia Plan: MAC   Post-op Pain Management:    Induction:   PONV Risk Score and Plan: 0 and Treatment may vary due to age or medical condition  Airway Management Planned: Natural Airway and Nasal Cannula  Additional Equipment: None  Intra-op Plan:   Post-operative Plan:   Informed Consent: I have reviewed the patients History and Physical, chart, labs and discussed the procedure including the risks, benefits and alternatives for the proposed anesthesia with the patient or authorized representative who has indicated his/her understanding and acceptance.     Dental advisory given  Plan Discussed with: Surgeon and CRNA  Anesthesia Plan Comments:        Anesthesia Quick Evaluation

## 2020-05-08 NOTE — Anesthesia Postprocedure Evaluation (Signed)
Anesthesia Post Note  Patient: Hunter Bradshaw  Procedure(s) Performed: ESOPHAGOGASTRODUODENOSCOPY (EGD) WITH PROPOFOL (N/A ) BIOPSY HEMOSTASIS CLIP PLACEMENT HOT HEMOSTASIS (ARGON PLASMA COAGULATION/BICAP) (N/A )     Patient location during evaluation: Endoscopy Anesthesia Type: MAC Level of consciousness: awake and alert, oriented and patient cooperative Pain management: pain level controlled Vital Signs Assessment: post-procedure vital signs reviewed and stable Respiratory status: nonlabored ventilation, spontaneous breathing and respiratory function stable Cardiovascular status: blood pressure returned to baseline and stable Postop Assessment: no apparent nausea or vomiting and adequate PO intake Anesthetic complications: no   No complications documented.  Last Vitals:  Vitals:   05/08/20 1130 05/08/20 1140  BP: (!) 144/64 132/66  Pulse: 82 81  Resp: 14 12  Temp:    SpO2: 98% 100%    Last Pain:  Vitals:   05/08/20 1140  TempSrc:   PainSc: 0-No pain                 Sullivan Blasing,E. Leonela Kivi

## 2020-05-08 NOTE — Interval H&P Note (Signed)
History and Physical Interval Note:  05/08/2020 10:26 AM  Hunter Bradshaw  has presented today for surgery, with the diagnosis of upper gi bleed.  The various methods of treatment have been discussed with the patient and family. After consideration of risks, benefits and other options for treatment, the patient has consented to  Procedure(s): ESOPHAGOGASTRODUODENOSCOPY (EGD) WITH PROPOFOL (N/A) as a surgical intervention.  The patient's history has been reviewed, patient examined, no change in status, stable for surgery.  I have reviewed the patient's chart and labs.  Questions were answered to the patient's satisfaction.     Lynann Bologna

## 2020-05-08 NOTE — Progress Notes (Signed)
Family Medicine Teaching Service Daily Progress Note Intern Pager: [phone]  Patient name: Hunter Bradshaw Medical record number: [id number] Date of birth: 1975-11-25 Age: 44 y.o. Gender: male  Primary Care Provider: Juliette Alcide, MD Consultants: GI, Urology Code Status: FULL  Pt Overview and Major Events to Date:  12/26: Admitted 12/26: Cystoscopy, right retrograde pyelogram, right ureteral stent placement  Assessment and Plan: Hunter Bradshaw is a 44 y.o. male who presented with melena and abdominal pain found to have kidney stone. PMH is significant for GERD, L4 -S4 fusion, HTN  Anemia 2/2 GI bleed Patient with notable family history of colon cancer, no history of prior EGD/colonoscopy. Hgb 9.5>9.9>8.8>6.1 (this AM). 1U PRBC ordered. Plan for endoscopy today. -Appreciate GI consult and care  -F/u EGD -Continue Protonix gtt (72 hours total), followed by IV Protonix 40 mg  -Follow up post-transfusion H&H  -Vitals per floor protocol  -continue mIVF  Right Ureteral Stone s/p stent placement Underwent cystoscopy yesterday, 12/26, with right ureteral stent placement.  -Morphine 2mg  IV q3h PRN moderate pain   Right Foot Cellulitis Patient with prior history of ORIF of calcaneous fracture 12/03/19. Has been taking Doxycycline BID for 5 days prior to admission. Started on Vancomycin yesterday. -Continue vancomycin -Follow up wound cultures  -Consider surgical consult/I&D pending success with IV abx  Nodular Liver  CTA abdomen and pelvis showed nodular contour and concerning for cirrhosis. Patient denies any history of alcohol use. CMP pending.   Chronic back pain Patient with history of surgical fusion at L4 through S1 - morphine 2mg  q3h PRN as above   Tobacco use  Reports 1/2 pack per day for 20 years - Nicotine patch 14mg   -Encourage cessation  Polysubstance Use History of Meth and Heroine use. Previous records also report potential use while in the  hospital. -Monitor  FEN/GI: NPO PPx: SCD   Status is: Inpatient  Remains inpatient appropriate because:Ongoing diagnostic testing needed not appropriate for outpatient work up   Dispo:  Patient From: Home  Planned Disposition: Home  Expected discharge date: 05/12/2020  Medically stable for discharge: No   Subjective:  Patient continues to have abdominal pain and some back pain, though that has improved since his procedure yesterday. Does endorse some burning when he pees. Denies blood in his urine. No bloody stools since hospitalization. States this is the first time he has had bloody stools. Still having some pain in his right foot. Occasionally notes white drainage from the areas that are now scabbed over.   Objective: Temp:  [97.6 F (36.4 C)-98.3 F (36.8 C)] 98 F (36.7 C) (12/27 0300) Pulse Rate:  [90-120] 90 (12/27 0300) Resp:  [15-22] 15 (12/27 0300) BP: (100-140)/(53-85) 123/66 (12/27 0300) SpO2:  [96 %-100 %] 99 % (12/27 0300) Weight:  [111.1 kg] 111.1 kg (12/26 1732) Physical Exam: General: NAD, in police custody-officer at bedside, feet shackled to bed Cardiovascular: RRR, without murmur Respiratory: CTAB without wheezing/rhonchi/rales Abdomen: diffuse tenderness to mild palpation in all quadrants. No R/G. Abdomen is obese but soft and non-distended. Normoactive bowel sounds Extremities: Warm and dry. 2+ DP pulses b/l. Right ankle with scab over medial malleolus. No significantly increased warmth compared to left foot. No streaking noted. Mild pain on palpation to right medial malleolus.  Laboratory: Recent Labs  Lab 05/07/20 1146 05/07/20 1154 05/07/20 2224  WBC 19.0*  --  19.1*  HGB 9.5* 9.9* 8.8*  HCT 30.8* 29.0* 26.4*  PLT 250  --  148*   Recent Labs  Lab 05/07/20 1146 05/07/20 1154  NA 136 139  K 4.0 4.1  CL 106 104  CO2 20*  --   BUN 38* 38*  CREATININE 0.92 0.70  CALCIUM 8.6*  --   PROT 6.7  --   BILITOT 0.1*  --   ALKPHOS 80  --    ALT 80*  --   AST 48*  --   GLUCOSE 159* 150*    Imaging/Diagnostic Tests: CTA Abdomen and Pelvis 12/26 IMPRESSION: VASCULAR 1. No evidence for active GI bleeding. 2. Mild atherosclerotic disease in the aorta and iliac arteries without significant stenosis. Main visceral arteries are patent.  NON-VASCULAR 1. Mild right hydroureteronephrosis secondary to a 3 mm stone in the distal right ureter. Additional tiny right kidney stone. 2. Old fractures involving right ribs and lumbar left transverse processes. Stable appearance of the surgical fusion at L4 through S1. 3. Liver has a nodular contour and concerning for cirrhosis.  DG Retrograde Pyelogram 12/26 IMPRESSION: Retrograde right ureteral catheterization  Sabino Dick, DO 05/08/2020, 5:04 AM PGY-1, [facility] Family Medicine FPTS Intern pager: [phone], text pages welcome

## 2020-05-09 ENCOUNTER — Encounter (HOSPITAL_COMMUNITY): Payer: Self-pay | Admitting: Gastroenterology

## 2020-05-09 DIAGNOSIS — M25571 Pain in right ankle and joints of right foot: Secondary | ICD-10-CM | POA: Diagnosis not present

## 2020-05-09 DIAGNOSIS — K922 Gastrointestinal hemorrhage, unspecified: Secondary | ICD-10-CM | POA: Diagnosis not present

## 2020-05-09 DIAGNOSIS — N201 Calculus of ureter: Secondary | ICD-10-CM | POA: Diagnosis not present

## 2020-05-09 DIAGNOSIS — G8929 Other chronic pain: Secondary | ICD-10-CM

## 2020-05-09 LAB — CBC WITH DIFFERENTIAL/PLATELET
Abs Immature Granulocytes: 0.03 10*3/uL (ref 0.00–0.07)
Basophils Absolute: 0 10*3/uL (ref 0.0–0.1)
Basophils Relative: 0 %
Eosinophils Absolute: 0.2 10*3/uL (ref 0.0–0.5)
Eosinophils Relative: 3 %
HCT: 22.6 % — ABNORMAL LOW (ref 39.0–52.0)
Hemoglobin: 7.3 g/dL — ABNORMAL LOW (ref 13.0–17.0)
Immature Granulocytes: 1 %
Lymphocytes Relative: 30 %
Lymphs Abs: 1.7 10*3/uL (ref 0.7–4.0)
MCH: 30.4 pg (ref 26.0–34.0)
MCHC: 32.3 g/dL (ref 30.0–36.0)
MCV: 94.2 fL (ref 80.0–100.0)
Monocytes Absolute: 0.3 10*3/uL (ref 0.1–1.0)
Monocytes Relative: 5 %
Neutro Abs: 3.6 10*3/uL (ref 1.7–7.7)
Neutrophils Relative %: 61 %
Platelets: 108 10*3/uL — ABNORMAL LOW (ref 150–400)
RBC: 2.4 MIL/uL — ABNORMAL LOW (ref 4.22–5.81)
RDW: 16.6 % — ABNORMAL HIGH (ref 11.5–15.5)
WBC: 5.9 10*3/uL (ref 4.0–10.5)
nRBC: 0 % (ref 0.0–0.2)

## 2020-05-09 LAB — GLUCOSE, CAPILLARY
Glucose-Capillary: 109 mg/dL — ABNORMAL HIGH (ref 70–99)
Glucose-Capillary: 189 mg/dL — ABNORMAL HIGH (ref 70–99)
Glucose-Capillary: 132 mg/dL — ABNORMAL HIGH (ref 70–99)
Glucose-Capillary: 121 mg/dL — ABNORMAL HIGH (ref 70–99)

## 2020-05-09 LAB — TYPE AND SCREEN
ABO/RH(D): A POS
Antibody Screen: NEGATIVE
Unit division: 0

## 2020-05-09 LAB — BASIC METABOLIC PANEL
Anion gap: 9 (ref 5–15)
BUN: 9 mg/dL (ref 6–20)
CO2: 19 mmol/L — ABNORMAL LOW (ref 22–32)
Calcium: 7.5 mg/dL — ABNORMAL LOW (ref 8.9–10.3)
Chloride: 109 mmol/L (ref 98–111)
Creatinine, Ser: 0.79 mg/dL (ref 0.61–1.24)
GFR, Estimated: >60 mL/min (ref >60)
Glucose, Bld: 87 mg/dL (ref 70–99)
Potassium: 3.5 mmol/L (ref 3.5–5.1)
Sodium: 137 mmol/L (ref 135–145)

## 2020-05-09 LAB — BPAM RBC
Blood Product Expiration Date: 202201132359
ISSUE DATE / TIME: 202112270826
Unit Type and Rh: 6200

## 2020-05-09 LAB — HEMOGLOBIN AND HEMATOCRIT, BLOOD
HCT: 22.4 % — ABNORMAL LOW (ref 39.0–52.0)
Hemoglobin: 7.5 g/dL — ABNORMAL LOW (ref 13.0–17.0)

## 2020-05-09 LAB — SURGICAL PATHOLOGY

## 2020-05-09 LAB — HEPATITIS B SURFACE ANTIBODY, QUANTITATIVE: Hep B S AB Quant (Post): <3.1 m[IU]/mL — ABNORMAL LOW (ref >9.9)

## 2020-05-09 MED ORDER — PANTOPRAZOLE SODIUM 40 MG PO TBEC: 40.0000 mg | DELAYED_RELEASE_TABLET | Freq: Two times a day (BID) | ORAL | Status: DC

## 2020-05-09 MED ORDER — ACETAMINOPHEN 500 MG PO TABS
1000.0000 mg | ORAL_TABLET | Freq: Three times a day (TID) | ORAL | Status: DC
  Administered 2020-05-09 – 2020-05-11 (×7): 1000 mg via ORAL
  Filled 2020-05-09 (×7): qty 2

## 2020-05-09 MED ORDER — OXYCODONE HCL 5 MG PO TABS: 2.5000 mg | ORAL_TABLET | ORAL | Status: DC | PRN

## 2020-05-09 MED ORDER — HEPATITIS B VAC RECOMBINANT 10 MCG/ML IJ SUSP
1.0000 mL | Freq: Once | INTRAMUSCULAR | Status: AC
  Administered 2020-05-09: 21:00:00 10 ug via INTRAMUSCULAR
  Filled 2020-05-09: qty 1

## 2020-05-09 MED ORDER — OXYCODONE HCL 5 MG PO TABS
5.0000 mg | ORAL_TABLET | ORAL | Status: DC | PRN
  Administered 2020-05-09 – 2020-05-10 (×4): 5 mg via ORAL
  Filled 2020-05-09 (×4): qty 1

## 2020-05-09 NOTE — Progress Notes (Signed)
Family Medicine Teaching Service Daily Progress Note Intern Pager: [phone]  Patient name: Hunter Bradshaw Medical record number: [id number] Date of birth: 12-17-1975 Age: 44 y.o. Gender: male  Primary Care Provider: Juliette Alcide, MD Consultants: GI, urology   Code Status: Full Code  Pt Overview and Major Events to Date:  12/26: Admitted 12/26: Cystoscopy, right retrograde pyelogram, right ureteral stent placement 12/27: 1u pRBC, EGD  Assessment and Plan: Hunter Bradshaw is a 44 y.o. male who presents with melena and abdominal pain found to have duodenal ulcer, also with kidney stone s/p right ureteral stent placement. PMHx significant for: GERD, L4-S4 fusion, HTN.  Duodenal ulcer With resulting GI bleed and anemia s/p 1 unit pRBC this admission, now s/p EGD with treatment of 8 mm duodenal ulcer with epinephrine and hemostatic clips. Slight drop in Hgb this AM 8.1 > 7.5. - continue pantoprazole gtt (72 hours total), plan to transition to p.o. 40 mg twice a day for 4 weeks, then 40 mg once a day indefinitely - PM CBC w/ diff  Right ureteral stone S/p cystoscopy with right ureteral stent placement 12/26. Plan for urology outpatient follow-up for left ureteral stone. - scheduled Tylenol 1000 mg q8h - prn oxycodone 2.5 / 5 mg for moderate / severe pain  Right foot cellulitis History of ORIF of calcaneous fracture 12/03/19. MRI yesterday 12/27 without evidence of osteomyelitis or abscess, antibiotics discontinued.  No evidence of infection currently. - monitor  Nodular liver CT abdomen and pelvis showing nodular contour, concerning for cirrhosis. Mild transaminitis and hypoalbuminemia. May have underlying hepatitis C, see below.  HCV Ab positive - f/u HCV RNA  HBV non-immune - HBV vaccine prior to discharge  Chronic back pain History of L4-S1 fusion. - pain management as above  Tobacco use 0.5 ppd smoker for 20 years. - nicotine patch 14 ,mg - encourage  cessation  Substance use disorder History of methamphetamine and heroin use, with potential use while in the hospital per previous records. - monitor  FEN/GI: carb-modified diet, NS @ 75 ml/hr, can stop this afternoon if taking adequate PO PPx: SCDs  Disposition: progressive, inpatient at least while receiving IV PPI  Subjective:  NAOE. Feels better since the procedure yesterday. Some discomfort in his abdomen still.  Objective: Temp:  [97.7 F (36.5 C)-98.5 F (36.9 C)] 97.7 F (36.5 C) (12/28 0300) Pulse Rate:  [81-112] 92 (12/28 0400) Resp:  [12-22] 14 (12/28 0400) BP: (117-146)/(61-79) 127/79 (12/28 0400) SpO2:  [97 %-100 %] 97 % (12/28 0400) Physical Exam: General: Middle-aged male, NAD, feet shackled to bed Cardiovascular: RRR, no murmur Respiratory: CTAB Abdomen: soft, mild diffuse tenderness, +BS Extremities: WWP, scab present on right ankle over medial malleolus without drainage  Laboratory: Recent Labs  Lab 05/07/20 1146 05/07/20 1154 05/07/20 2224 05/08/20 0558 05/08/20 1811 05/09/20 0216  WBC 19.0*  --  19.1* 15.9*  --   --   HGB 9.5*   < > 8.8* 6.1* 8.1* 7.5*  HCT 30.8*   < > 26.4* 18.2* 23.8* 22.4*  PLT 250  --  148* 164  --   --    < > = values in this interval not displayed.   Recent Labs  Lab 05/07/20 1146 05/07/20 1154 05/08/20 0558 05/09/20 0216  NA 136 139 138 137  K 4.0 4.1 3.6 3.5  CL 106 104 109 109  CO2 20*  --  20* 19*  BUN 38* 38* 19 9  CREATININE 0.92 0.70 0.85 0.79  CALCIUM 8.6*  --  7.7* 7.5*  PROT 6.7  --  5.7*  --   BILITOT 0.1*  --  0.6  --   ALKPHOS 80  --  70  --   ALT 80*  --  65*  --   AST 48*  --  44*  --   GLUCOSE 159* 150* 92 87    Imaging/Diagnostic Tests: MR ANKLE RIGHT W WO CONTRAST  Result Date: 05/08/2020 CLINICAL DATA:  Calcaneal fracture with ORIF 5 months ago. Heel wound. Osteomyelitis suspected. EXAM: MRI OF THE RIGHT ANKLE WITHOUT AND WITH CONTRAST TECHNIQUE: Multiplanar, multisequence MR imaging of  the ankle was performed before and after the administration of intravenous contrast. CONTRAST:  62mL GADAVIST GADOBUTROL 1 MMOL/ML IV SOLN COMPARISON:  Radiographs 05/07/2020 and 12/03/2019.  CT 12/03/2019. 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 The peroneal tendons are intact without significant tenosynovitis. Posteromedial: Intact and normally positioned. Mild thickening of the posterior tibialis tendon. Anterior: Intact and normally positioned. Achilles: Intact. Plantar Fascia: Intact. LIGAMENTS Lateral: The anterior and posterior talofibular and calcaneofibular ligaments are intact. Medial: The deltoid ligament appears thickened but intact. CARTILAGE AND BONES Ankle Joint: Summer ankle joint effusion without suspicious synovial enhancement. The talar dome and tibial plafond appear normal. Subtalar Joints/Sinus Tarsi: There postsurgical changes within the calcaneus and subtalar joint consistent with previous ORIF and subtalar arthrodesis. The hardware appears intact and unchanged from previous radiographs. There is posttraumatic deformity of the subtalar joint related to the calcaneal fractures. Mild diffuse surrounding synovial enhancement extending into the tarsal sinus. Bones: Multifocal patchy bone marrow edema with low level enhancement throughout the calcaneus, talus, cuboid and navicular. As above, there are postsurgical changes related to ORIF of the comminuted intra-articular calcaneal fracture and subtalar arthrodesis. There is incomplete fracture of a transverse fracture involving the medial malleolus post ORIF. No cortical destruction or surrounding fluid collections to suggest osteomyelitis. Type 2 accessory navicular and mild midfoot degenerative changes are noted. Other: Mild edema throughout the muscles of the hindfoot and midfoot without focal fluid collection or  suspicious enhancement. No skin ulceration or focal fluid collection identified to suggest abscess. IMPRESSION: IMPRESSION 1. Postsurgical changes related to previous ORIF of the comminuted intra-articular calcaneal fracture and subtalar arthrodesis. The hardware appears intact and unchanged from previous radiographs. 2. Multifocal patchy bone marrow edema with low level enhancement throughout the calcaneus, talus, cuboid and navicular. No cortical destruction or surrounding fluid collections to suggest osteomyelitis. The fractures appear incompletely healed. Follow-up CT may be full to better assess fracture healing. 3. 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 No acute tendon findings. 4. No evidence of abscess. Electronically Signed   By: Carey Bullocks M.D.   On: 05/08/2020 16:09     Littie Deeds, MD 05/09/2020, 7:07 AM PGY-1, [facility] Family Medicine FPTS Intern pager: [phone], text pages welcome
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
## 2020-05-10 DIAGNOSIS — L24A9 Irritant contact dermatitis due friction or contact with other specified body fluids: Secondary | ICD-10-CM

## 2020-05-10 DIAGNOSIS — N201 Calculus of ureter: Secondary | ICD-10-CM | POA: Diagnosis not present

## 2020-05-10 DIAGNOSIS — K922 Gastrointestinal hemorrhage, unspecified: Secondary | ICD-10-CM | POA: Diagnosis not present

## 2020-05-10 DIAGNOSIS — K921 Melena: Secondary | ICD-10-CM | POA: Diagnosis not present

## 2020-05-10 LAB — CBC
HCT: 22.6 % — ABNORMAL LOW (ref 39.0–52.0)
Hemoglobin: 7.3 g/dL — ABNORMAL LOW (ref 13.0–17.0)
MCH: 30.5 pg (ref 26.0–34.0)
MCHC: 32.3 g/dL (ref 30.0–36.0)
MCV: 94.6 fL (ref 80.0–100.0)
Platelets: 110 10*3/uL — ABNORMAL LOW (ref 150–400)
RBC: 2.39 MIL/uL — ABNORMAL LOW (ref 4.22–5.81)
RDW: 16.5 % — ABNORMAL HIGH (ref 11.5–15.5)
WBC: 5.9 10*3/uL (ref 4.0–10.5)
nRBC: 0 % (ref 0.0–0.2)

## 2020-05-10 LAB — GLUCOSE, CAPILLARY
Glucose-Capillary: 105 mg/dL — ABNORMAL HIGH (ref 70–99)
Glucose-Capillary: 113 mg/dL — ABNORMAL HIGH (ref 70–99)

## 2020-05-10 MED ORDER — AMOXICILLIN 500 MG PO CAPS
1000.0000 mg | ORAL_CAPSULE | Freq: Two times a day (BID) | ORAL | Status: DC
  Administered 2020-05-11: 10:00:00 1000 mg via ORAL
  Filled 2020-05-10 (×2): qty 2

## 2020-05-10 MED ORDER — METRONIDAZOLE 500 MG PO TABS
500.0000 mg | ORAL_TABLET | Freq: Two times a day (BID) | ORAL | Status: DC
  Administered 2020-05-11: 10:00:00 500 mg via ORAL
  Filled 2020-05-10: qty 1

## 2020-05-10 MED ORDER — CLARITHROMYCIN 500 MG PO TABS
500.0000 mg | ORAL_TABLET | Freq: Two times a day (BID) | ORAL | Status: DC
  Administered 2020-05-11: 10:00:00 500 mg via ORAL
  Filled 2020-05-10 (×2): qty 1

## 2020-05-10 NOTE — Progress Notes (Addendum)
Family Medicine Teaching Service Daily Progress Note Intern Pager: [phone]  Patient name: Hunter Bradshaw Medical record number: [id number] Date of birth: [birth year] Age: 44 y.o. Gender: male  Primary Care Provider: Juliette Alcide, MD Consultants: GI, urology   Code Status: Full Code  Pt Overview and Major Events to Date:  12/26: Admitted 12/26: Cystoscopy, right retrograde pyelogram, right ureteral stent placement 12/27: 1u pRBC, EGD  Assessment and Plan: Hunter Bradshaw is a 44 y.o. male who presents with melena and abdominal pain found to have duodenal ulcer, also with kidney stone s/p right ureteral stent placement. PMHx significant for: GERD, L4-S4 fusion, HTN.  Duodenal ulcer With resulting GI bleed and anemia s/p 1 unit pRBC this admission, now s/p EGD with treatment of 8 mm duodenal ulcer with epinephrine and hemostatic clips. Slight drop in Hgb this AM 8.1 > 7.5>7.3>7.3.  Per GI the biopsy was positive for H. pylori. -GI signed off, appreciate their assistance.  -At discharge will prescribe amoxicillin 1 g twice daily, clarithromycin 500 mg twice daily, Flagyl 500 mg twice daily for H. pylori  -After 2 months perform a stool study for H. pylori antigen to ensure negative-PPI should be held at least 2 weeks prior to obtaining the sample  - continue pantoprazole gtt (72 hours total), plan to transition to p.o. 40 mg twice a day for 4 weeks, then 40 mg once a day indefinitely  -repeat EGD in March 2022  Thrombocytopenia: Morning platelet count 110, per chart review patient has had platelets ranging from 109-250 over the past 6 months.  Most likely etiology would be H pylori which the patient is positive for. -We will recommend repeat CBC in the outpatient setting.  Right ureteral stone S/p cystoscopy with right ureteral stent placement 12/26. Plan for urology outpatient follow-up for left ureteral stone. - scheduled Tylenol 1000 mg q8h - prn oxycodone 2.5 / 5 mg for moderate /  severe pain  Right foot cellulitis History of ORIF of calcaneous fracture 12/03/19. MRI 12/27 without evidence of osteomyelitis or abscess, antibiotics discontinued.  No evidence of infection currently. -Continue to monitor  Nodular liver CT abdomen and pelvis showing nodular contour, concerning for cirrhosis. Mild transaminitis and hypoalbuminemia. May have underlying hepatitis C, see below.  HCV Ab positive - f/u HCV RNA  HBV non-immune - HBV vaccine given  Chronic back pain History of L4-S1 fusion. - pain management as above  Tobacco use 0.5 ppd smoker for 20 years. - nicotine patch 14 ,mg - encourage cessation  Substance use disorder History of methamphetamine and heroin use, with potential use while in the hospital per previous records. - monitor  FEN/GI: carb-modified diet PPx: SCDs  Disposition: progressive, inpatientwhile receiving IV PPI, can likely discharge 12/30 once no longer on IV PPI  Subjective:  States he generally feels a bit better but hasnt had a BM today to see if he's having any bleeding. Admits to some mild abdominal discomfort that he says is similar to yesterday.  Objective: Temp:  [97.8 F (36.6 C)-98.1 F (36.7 C)] 98.1 F (36.7 C) (12/29 0500) Pulse Rate:  [86-105] 86 (12/29 0500) Resp:  [13-18] 14 (12/29 0500) BP: (108-148)/(61-80) 113/61 (12/29 0500) SpO2:  [96 %-100 %] 100 % (12/29 0500) Physical Exam: General: Alert and oriented in no apparent distress Heart: Regular rate and rhythm with no murmurs appreciated Lungs: CTA bilaterally, no wheezing Abdomen: Bowel sounds present, mild abdominal discomfort to palpation in lower region, no acute surgical findings. Skin: Warm and  dry Extremities: No lower extremity edema   Laboratory: Recent Labs  Lab 05/08/20 0558 05/08/20 1811 05/09/20 0216 05/09/20 1543 05/10/20 0206  WBC 15.9*  --   --  5.9 5.9  HGB 6.1*   < > 7.5* 7.3* 7.3*  HCT 18.2*   < > 22.4* 22.6* 22.6*  PLT 164  --    --  108* 110*   < > = values in this interval not displayed.   Recent Labs  Lab 05/07/20 1146 05/07/20 1154 05/08/20 0558 05/09/20 0216  NA 136 139 138 137  K 4.0 4.1 3.6 3.5  CL 106 104 109 109  CO2 20*  --  20* 19*  BUN 38* 38* 19 9  CREATININE 0.92 0.70 0.85 0.79  CALCIUM 8.6*  --  7.7* 7.5*  PROT 6.7  --  5.7*  --   BILITOT 0.1*  --  0.6  --   ALKPHOS 80  --  70  --   ALT 80*  --  65*  --   AST 48*  --  44*  --   GLUCOSE 159* 150* 92 87    Imaging/Diagnostic Tests: No results found.   Jackelyn Poling, DO 05/10/2020, 6:08 AM PGY-2, [facility] FPTS Intern pager: [phone], text pages welcome

## 2020-05-11 DIAGNOSIS — N201 Calculus of ureter: Secondary | ICD-10-CM

## 2020-05-11 DIAGNOSIS — K264 Chronic or unspecified duodenal ulcer with hemorrhage: Principal | ICD-10-CM

## 2020-05-11 LAB — HCV RNA QUANT RFLX ULTRA OR GENOTYP
HCV RNA Qnt(log copy/mL): 6.121 log10 IU/mL
HepC Qn: 1320000 IU/mL

## 2020-05-11 LAB — HEPATIC FUNCTION PANEL
ALT: 54 U/L — ABNORMAL HIGH (ref 0–44)
AST: 49 U/L — ABNORMAL HIGH (ref 15–41)
Albumin: 2.4 g/dL — ABNORMAL LOW (ref 3.5–5.0)
Alkaline Phosphatase: 72 U/L (ref 38–126)
Bilirubin, Direct: 0.2 mg/dL (ref 0.0–0.2)
Indirect Bilirubin: 0.2 mg/dL — ABNORMAL LOW (ref 0.3–0.9)
Total Bilirubin: 0.4 mg/dL (ref 0.3–1.2)
Total Protein: 5.4 g/dL — ABNORMAL LOW (ref 6.5–8.1)

## 2020-05-11 LAB — CBC
HCT: 22.5 % — ABNORMAL LOW (ref 39.0–52.0)
Hemoglobin: 7.8 g/dL — ABNORMAL LOW (ref 13.0–17.0)
MCH: 31.7 pg (ref 26.0–34.0)
MCHC: 34.7 g/dL (ref 30.0–36.0)
MCV: 91.5 fL (ref 80.0–100.0)
Platelets: UNDETERMINED 10*3/uL (ref 150–400)
RBC: 2.46 MIL/uL — ABNORMAL LOW (ref 4.22–5.81)
RDW: 16.9 % — ABNORMAL HIGH (ref 11.5–15.5)
WBC: 5.4 10*3/uL (ref 4.0–10.5)
nRBC: 0 % (ref 0.0–0.2)

## 2020-05-11 LAB — HEPATITIS C GENOTYPE

## 2020-05-11 LAB — BASIC METABOLIC PANEL
Anion gap: 7 (ref 5–15)
BUN: 9 mg/dL (ref 6–20)
CO2: 24 mmol/L (ref 22–32)
Calcium: 8.1 mg/dL — ABNORMAL LOW (ref 8.9–10.3)
Chloride: 107 mmol/L (ref 98–111)
Creatinine, Ser: 1.03 mg/dL (ref 0.61–1.24)
GFR, Estimated: >60 mL/min (ref >60)
Glucose, Bld: 103 mg/dL — ABNORMAL HIGH (ref 70–99)
Potassium: 3.7 mmol/L (ref 3.5–5.1)
Sodium: 138 mmol/L (ref 135–145)

## 2020-05-11 MED ORDER — METRONIDAZOLE 500 MG PO TABS: 500.0000 mg | ORAL_TABLET | Freq: Two times a day (BID) | ORAL | 0 refills | Status: AC

## 2020-05-11 MED ORDER — AMOXICILLIN 500 MG PO CAPS: 1000.0000 mg | ORAL_CAPSULE | Freq: Two times a day (BID) | ORAL | 0 refills | Status: AC

## 2020-05-11 MED ORDER — CLARITHROMYCIN 500 MG PO TABS: 500.0000 mg | ORAL_TABLET | Freq: Two times a day (BID) | ORAL | 0 refills | Status: AC

## 2020-05-11 MED ORDER — MELATONIN 5 MG PO TABS: 5.0000 mg | ORAL_TABLET | Freq: Every day | ORAL | 0 refills | Status: AC

## 2020-05-11 MED ORDER — PANTOPRAZOLE SODIUM 40 MG PO TBEC: DELAYED_RELEASE_TABLET | ORAL | 3 refills | Status: AC

## 2020-05-11 MED ORDER — PREGABALIN 200 MG PO CAPS: 200.0000 mg | ORAL_CAPSULE | Freq: Every evening | ORAL | 0 refills | Status: AC

## 2020-05-11 NOTE — Discharge Instructions (Signed)
   Diet: You should advance your diet as instructed by your physician.  It will be normal to have some bloating, nausea, and abdominal discomfort intermittently.  Please avoid aspirin and any NSAID due to your ulcer.  What to call us about: You should call the office [phone]) if you develop fever > 101 or develop persistent vomiting. Activity:  You are encouraged to ambulate frequently (about every hour during waking hours) to help prevent blood clots from forming in your legs or lungs.   You have a right ureteral stent in place. This is temporary and must be removed. Please follow up with [facility] to schedule removal and definitive treatment of stone.

## 2020-05-11 NOTE — Discharge Summary (Addendum)
Family Medicine Teaching [facility] Discharge Summary  Patient name: Hunter Bradshaw Medical record number: [id number] Date of birth: [birth year] Age: 44 y.o. Gender: male Date of Admission: 05/07/2020  Date of Discharge: 05/11/2020 Admitting Physician: Cora Collum, DO  Primary Care Provider: Juliette Alcide, MD Consultants: Urology, GI  Indication for Hospitalization: melena and abdominal pain  Discharge Diagnoses/Problem List:  Duodenal ulcer Hepatitis C Right ureteral stone  Syncope Right calcaneal wound  H Pylori   Disposition: correctional facility  Discharge Condition: medically stable   Discharge Exam:   Temp:  [97.7 F (36.5 C)-98.3 F (36.8 C)] 97.7 F (36.5 C) (12/30 0509) Pulse Rate:  [85-96] 87 (12/30 0509) Resp:  [14-20] 15 (12/30 0509) BP: (121-141)/(54-85) 121/78 (12/30 0509) SpO2:  [95 %-100 %] 100 % (12/30 0509)  Physical Exam: General: Patient laying comfortably in bed, in no acute distress. Cardiovascular: RRR, no murmurs appreciated  Respiratory: lungs clear to auscultation bilaterally  Abdomen: soft, nontender, presence of active bowel sounds Extremities: radial pulses strong and equal bilaterally   Brief Hospital Course:  Hunter Bradshaw is a 44 y.o. male with pmhx of HTN, GERD, chronic back pain, arthritis and recent incarceration who presented with 3-day history of melena and an episode of syncope, found to have an actively bleeding duodenal ulcer on EGD. Below is his hospital course listed by problem. Refer to the H&P for additional information.  Melena secondary to actively bleeding duodenal ulcer  H. Pylor  Patient presented with hemoglobin of 9.5 which is consistent with prior hemoglobin measurements.  Patient reported multiple melenic stools prior to admission.  On the day of admission, patient states that he was standing to go to the restroom when he blacked out.  Patient was evaluated by gastroenterology for suspected GI bleed.   Patient had elevated white blood cell count of 19 and BUN elevated at 38.  He was suspected to have upper GI bleed.  He was treated with IV Protonix and scheduled for EGD on 12/27 which showed actively bleeding duodenal ulcer that was treated with epi/BiCAP/endoscopic clipping. Received 1total U of PRBC. Hgb stable on discharge at 7.8.  Biopsy noted positive for H. Pylor. Placed on triple therapy. Will need to follow up with stool antigen testing in 2 months. PPI will need to be held at least 2 weeks prior to retest.  Hepatitis C Found to have positive HCV antibody, likely from IVDU. Pending HCV RNA, but regardless of result patient will need Hep C viral load testing in 6 months.  Right ureteral stone Abdominal CT was completed in order to further evaluate for active GI bleed.  Imaging was not notable for active bleeding source however did note right ureter stone with hydronephrosis.  Patient did report some back pain in addition to abdominal pain.  He was treated with morphine IV and subsequently transition to oral pain management with scheduled Tylenol and as needed oxycodone.  Urology evaluated the patient in he under went cystoscopy with right stent placement on 05/08/20. He will need outpatient follow up for definitive treatment of right ureteral stone.  Syncope Patient reported to have syncopal episode prior to being transported to ED.  Patient has no significant cardiac history of arrhythmia or CVA.  Believe that patient syncope could be related to decreased intravascular volume due to bleeding as mentioned above. Patient was monitored on cardiac monitors with no dire abnormalities during this admission/SR.  Right Calcaneal Wound At the time of admission, patient was being treated with  doxycycline for a right foot wound after calcaneal fracture.  Patient was started on vancomycin (12/26-12/27) but this was d/c once MRI did not show any evidence of osteomyelitis or abscess. MRI showed intact  hardware that was unchanged from prior imaging, fracture appeared incompletely healed. Resolved upon discharge.    All other chronic conditions stable.      Issues for Follow Up:  1. Hepatitis C positive. Will need repeat hepatitis viral load in 6 months. Needs outpatient GI follow up. 2. Encourage smoking cessation as well as provide resources for further drug use cessation 3. Will need repeat EGD in 3 months. Protonix 40 mg po bid for 4 weeks, then decrease to 40 mg once daily indefinitely.   4.  Amoxicillin 1 g twice daily clindamycin 500 mg twice daily, Flagyl 500 mg twice daily for H. Pylori. After 2 months, will need repeat stool study for H. Pylori antigen (PPI should be held at least 2 weeks prior to sample). 5. Patient s/p right ureteral stent placement, needs urology outpatient follow up. 6. Repeat CBC to monitor thrombocytopenia after finished treatment for H. Pylori. 7. Recommend outpatient sleep study due to hypoxia overnight 8. Avoidance of NSAIDs and aspirin due to duodenal ulcer. 9. Encourage avoidance of alcohol.    Significant Procedures:  12/27 EGD demonstrated actively bleeding duodenal ulcer.  Significant Labs and Imaging:  Recent Labs  Lab 05/09/20 1543 05/10/20 0206 05/11/20 0053  WBC 5.9 5.9 5.4  HGB 7.3* 7.3* 7.8*  HCT 22.6* 22.6* 22.5*  PLT 108* 110* PLATELET CLUMPS NOTED ON SMEAR, UNABLE TO ESTIMATE   Recent Labs  Lab 05/07/20 1146 05/07/20 1154 05/08/20 0558 05/09/20 0216 05/11/20 0053  NA 136 139 138 137 138  K 4.0 4.1 3.6 3.5 3.7  CL 106 104 109 109 107  CO2 20*  --  20* 19* 24  GLUCOSE 159* 150* 92 87 103*  BUN 38* 38* 19 9 9   CREATININE 0.92 0.70 0.85 0.79 1.03  CALCIUM 8.6*  --  7.7* 7.5* 8.1*  ALKPHOS 80  --  70  --  72  AST 48*  --  44*  --  49*  ALT 80*  --  65*  --  54*  ALBUMIN 2.8*  --  2.5*  --  2.4*      Results/Tests Pending at Time of Discharge:  HCV RNA pending, but regardless will need HCV viral load testing in 6  months.  Discharge Medications:  Allergies as of 05/11/2020   No Known Allergies     Medication List    STOP taking these medications   cyclobenzaprine 5 MG tablet Commonly known as: FLEXERIL   naproxen 500 MG tablet Commonly known as: NAPROSYN   oxyCODONE-acetaminophen 5-325 MG tablet Commonly known as: PERCOCET/ROXICET     TAKE these medications   acetaminophen 500 MG tablet Commonly known as: TYLENOL Take 500 mg by mouth every 6 (six) hours as needed for moderate pain.   amoxicillin 500 MG capsule Commonly known as: AMOXIL Take 2 capsules (1,000 mg total) by mouth every 12 (twelve) hours for 14 days.   clarithromycin 500 MG tablet Commonly known as: BIAXIN Take 1 tablet (500 mg total) by mouth every 12 (twelve) hours for 14 days.   melatonin 5 MG Tabs Take 1 tablet (5 mg total) by mouth at bedtime.   metroNIDAZOLE 500 MG tablet Commonly known as: FLAGYL Take 1 tablet (500 mg total) by mouth every 12 (twelve) hours for 14 days.   pantoprazole  40 MG tablet Commonly known as: PROTONIX Take twice daily until 06/09/2020, then take once daily indefinitely   pregabalin 200 MG capsule Commonly known as: LYRICA Take 1 capsule (200 mg total) by mouth every evening.       Discharge Instructions: Please refer to Patient Instructions section of EMR for full details.  Patient was counseled important signs and symptoms that should prompt return to medical care, changes in medications, dietary instructions, activity restrictions, and follow up appointments.   Follow-Up Appointments:  Follow-up Information    Schedule an appointment as soon as possible for a visit with [facility].   Contact information: [address] [place] [address] [phone]              Reece Leader, DO 05/11/2020, 12:24 PM PGY-1, [facility] Family Medicine

## 2020-05-11 NOTE — Progress Notes (Signed)
Family Medicine Teaching Service Daily Progress Note Intern Pager: [phone]  Patient name: Hunter Bradshaw Medical record number: [id number] Date of birth: 03-19-1976 Age: 44 y.o. Gender: male  Primary Care Provider: Juliette Alcide, MD Consultants: GI, urology  Code Status: Full  Pt Overview and Major Events to Date:  12/26: Admitted 12/26: Cystoscopy, right retrograde pyelogram, right ureteral stent placement 12/27: 1u pRBC, EGD  Assessment and Plan: Hunter Bradshaw is a 44 y.o. male who presents with melena and abdominal pain found to have duodenal ulcer, also with kidney stone s/p right ureteral stent placement. PMHx significant for: GERD, L4-S4 fusion, HTN.  Duodenal ulcer With resulting GI bleed and anemia s/p 1 unit pRBC this admission, now s/p EGD with treatment of 8 mm duodenal ulcer with epinephrine and hemostatic clips. Stable Hgb 7.3>7.8.  Per GI the biopsy was positive for H. pylori. -GI signed off, appreciate their assistance.             -At discharge will prescribe amoxicillin 1 g twice daily, clarithromycin 500 mg twice daily, Flagyl 500 mg twice daily for H. pylori             -After 2 months perform a stool study for H. pylori antigen to ensure negative-PPI should be held at least 2 weeks prior to obtaining the sample             - continue pantoprazole gtt (72 hours total), plan to transition to p.o. 40 mg twice a day for 4 weeks, then 40 mg once a day indefinitely             -repeat EGD in March 2022  Thrombocytopenia: Likely secondary to H. Pylori. Platelet count 110 on 12/29, today unable to estimate given clumping. Baseline appears to be within 109-250 range. -Triple therapy: clarithromycin, amoxicillin, metronidazole  -We will recommend repeat CBC in the outpatient setting.  Right ureteral stone S/p cystoscopy with right ureteral stent placement 12/26. Plan for urology outpatient follow-up for left ureteral stone. - scheduled Tylenol 1000 mg q8h - prn  oxycodone 2.5 / 5 mg for moderate / severe pain  Right foot cellulitis Likely resolved. History of ORIF of calcaneous fracture 12/03/19. MRI 12/27 without evidence of osteomyelitis or abscess, antibiotics discontinued.  No evidence of infection currently.  Hepatic Cirrhosis  CT abdomen and pelvis showing nodular contour, concerning for cirrhosis. Mild transaminitis and hypoalbuminemia, AST 49 and ALT 54 with albumin 2.4. May have underlying hepatitis C, see below.  HCV Ab positive - pending HCV RNA  HBV non-immune - HBV vaccine given  Chronic back pain History of L4-S1 fusion. - pain management as above  Tobacco use 0.5 ppd smoker for 20 years. - nicotine patch 14 mg - encourage cessation  Substance use disorder History of methamphetamine and heroin use, with potential use while in the hospital per previous records. - monitor -encourage cessation   FEN/GI: carb modified diet  PPx: SCDs   Status is: Inpatient while on IV protonix    Dispo:  Patient From: Home  Planned Disposition: To be determined  Expected discharge date: 05/11/2020  Medically stable for discharge: No         Subjective:  No acute overnight events reported. Patient doing well, denies any concerns. Endorses some generalized abdominal soreness but no pain.   Objective: Temp:  [97.7 F (36.5 C)-98.3 F (36.8 C)] 97.7 F (36.5 C) (12/30 0509) Pulse Rate:  [85-96] 87 (12/30 0509) Resp:  [14-20] 15 (12/30 0509) BP: (  121-141)/(54-85) 121/78 (12/30 0509) SpO2:  [95 %-100 %] 100 % (12/30 0509) Physical Exam: General: Patient laying comfortably in bed, in no acute distress. Cardiovascular: RRR, no murmurs appreciated  Respiratory: lungs clear to auscultation bilaterally  Abdomen: soft, nontender, presence of active bowel sounds Extremities: radial pulses strong and equal bilaterally   Laboratory: Recent Labs  Lab 05/09/20 1543 05/10/20 0206 05/11/20 0053  WBC 5.9 5.9 5.4  HGB 7.3*  7.3* 7.8*  HCT 22.6* 22.6* 22.5*  PLT 108* 110* PLATELET CLUMPS NOTED ON SMEAR, UNABLE TO ESTIMATE   Recent Labs  Lab 05/07/20 1146 05/07/20 1154 05/08/20 0558 05/09/20 0216 05/11/20 0053  NA 136   < > 138 137 138  K 4.0   < > 3.6 3.5 3.7  CL 106   < > 109 109 107  CO2 20*  --  20* 19* 24  BUN 38*   < > 19 9 9   CREATININE 0.92   < > 0.85 0.79 1.03  CALCIUM 8.6*  --  7.7* 7.5* 8.1*  PROT 6.7  --  5.7*  --  5.4*  BILITOT 0.1*  --  0.6  --  0.4  ALKPHOS 80  --  70  --  72  ALT 80*  --  65*  --  54*  AST 48*  --  44*  --  49*  GLUCOSE 159*   < > 92 87 103*   < > = values in this interval not displayed.      Imaging/Diagnostic Tests: No results found.  , DO 05/11/2020, 6:14 AM PGY-1, [facility] Family Medicine FPTS Intern pager: [phone], text pages welcome

## 2020-05-12 LAB — CULTURE, BLOOD (ROUTINE X 2)
Culture: NO GROWTH
Culture: NO GROWTH
Special Requests: ADEQUATE
Special Requests: ADEQUATE

## 2020-05-13 ENCOUNTER — Encounter: Payer: Self-pay | Admitting: Family Medicine

## 2020-05-13 DIAGNOSIS — Z9289 Personal history of other medical treatment: Secondary | ICD-10-CM

## 2020-05-13 HISTORY — DX: Personal history of other medical treatment: Z92.89

## 2020-05-17 ENCOUNTER — Telehealth: Payer: Self-pay | Admitting: Family Medicine

## 2020-05-17 NOTE — Telephone Encounter (Signed)
Attempted to reach patient or representative regarding Hep C viral load.   Called [facility]. Left generic voicemail for nurse to call back.   If she calls back, please let ask how best to get important results to patient about viral testing. Patient will need follow up for treatment of hepatitis C.  Terisa Starr, MD  Family Medicine Teaching Service

## 2020-05-18 NOTE — Telephone Encounter (Signed)
Hunter Bradshaw calls back from [facility]. Hunter Bradshaw is in charge of medication records and advised me to fax over the lab results and their physician will go over with patient. Reviewed with Manson Passey and have sent results over via fax.

## 2020-05-19 NOTE — Progress Notes (Signed)
A message was left with the [facility] nurse regarding lab results and needing to be referred to ID. She was asked to call our office back to go over details.

## 2020-05-22 NOTE — Progress Notes (Signed)
Spoke to nurse Clydene Pugh at the [facility] where patient is being held.She was informed of patients lab results and Dr Urban Gibson recommendations. Nurse Clydene Pugh supplied a fax number for the reports to be faxed to the prison doctor for review and treat if indicated. She states that there facility does not typically treat Hep C,but will let the doctor decide once reviewed. All necessary papers faxed to [phone].

## 2020-05-22 NOTE — Progress Notes (Signed)
Spoke to nurse Clydene Pugh at [facility]. She had provided a fax number to send patients lab results and Dr Urban Gibson recommendations. She will send the results to the prison doctors attention for review. They will trest accordingly. The fax number where records sent [phone].

## 2020-07-17 ENCOUNTER — Other Ambulatory Visit: Payer: Self-pay | Admitting: Urology

## 2020-08-11 ENCOUNTER — Encounter (HOSPITAL_COMMUNITY): Payer: Self-pay | Admitting: Urology

## 2020-08-11 NOTE — Patient Instructions (Addendum)
Preop instructions for: Hunter Bradshaw     Date of Birth: 06-09-1975                      Date of Procedure:  08/18/2020 Procedure:  CYSTOSCOPY/RETORGRADE/URETEROSCOPY/HOLMIUM LASER/STENT PLACEMENT      Surgeon: Dr. Jettie Pagan Facility contact: [facility]    Phone:   [phone]              Health Care POA: RN contact name/phone#:     Lyla Son                     and Fax #: [phone]   Transportation contact phone#: [facility]   Time to arrive at [facility]:  10:30 AM   Report to: Admitting (On your left hand side)    Do not eat past midnight the night before your procedure.(To include any tube feedings-must be discontinued)  May have liquids until 10:30 AM day of procedure  CLEAR LIQUID DIET  Foods Allowed                                                                     Foods Excluded  Water, Black Coffee and tea, regular and decaf                             liquids that you cannot  Plain Jell-O in any flavor  (No red)                                           see through such as: Fruit ices (not with fruit pulp)                                     milk, soups, orange juice  Iced Popsicles (No red)                                    All solid food                                   Apple juices Sports drinks like Gatorade (No red) Lightly seasoned clear broth or consume(fat free) Sugar, honey syrup  Sample Menu Breakfast                                Lunch                                     Supper Cranberry juice                    Beef broth  Chicken broth Jell-O                                     Grape juice                           Apple juice Coffee or tea                        Jell-O                                      Popsicle                                                Coffee or tea                        Coffee or tea   Take these morning medications only with sips of water.(or give through  gastrostomy or feeding tube). Protonix   Please send day of procedure:current med list and meds last taken that day, confirm nothing by mouth status from what time, Patient Demographic info( to include DNR status, problem list, allergies)   Bring Insurance card and picture ID Leave all jewelry and other valuables at place where living( no metal or rings to be worn) No contact lens  Men-no colognes,lotions   Any questions day of procedure,call  SHORT STAY-[phone]     Sent from :[facility]                   Phone:[phone]                   Fax:[phone]   Sent by : Fredonia Highland, BSN, RN

## 2020-08-11 NOTE — Progress Notes (Signed)
COVID Vaccine Completed: No Date COVID Vaccine completed: N/A Has received booster: N/A COVID vaccine manufacturer: N/A Date of COVID positive in last 90 days: No  PCP - Juliette Alcide, MD Cardiologist - N/A  Chest x-ray - 05/07/20 in epic EKG - 05/07/20 in epic Stress Test - N/A ECHO - N/A Cardiac Cath - N/A Pacemaker/ICD device last checked:N/A  Sleep Study - N/A CPAP - N/A  Fasting Blood Sugar - N/A Checks Blood Sugar _N/A____ times a day  Blood Thinner Instructions: N/A Aspirin Instructions: N/A Last Dose: N/A  Activity level:  Can go up a flight of stairs and activities of daily living without stopping and without symptoms       Anesthesia review: N/A  Information received from [place], [facility] Nurse

## 2020-08-18 ENCOUNTER — Encounter (HOSPITAL_COMMUNITY): Payer: Self-pay | Admitting: Urology

## 2020-08-18 ENCOUNTER — Ambulatory Visit (HOSPITAL_COMMUNITY): Payer: PRIVATE HEALTH INSURANCE

## 2020-08-18 ENCOUNTER — Ambulatory Visit (HOSPITAL_COMMUNITY): Payer: PRIVATE HEALTH INSURANCE | Admitting: Anesthesiology

## 2020-08-18 ENCOUNTER — Ambulatory Visit (HOSPITAL_COMMUNITY)
Admission: RE | Admit: 2020-08-18 | Discharge: 2020-08-18 | Disposition: A | Discharge status: Home / Self Care | Payer: PRIVATE HEALTH INSURANCE | Attending: Urology | Admitting: Urology

## 2020-08-18 ENCOUNTER — Encounter (HOSPITAL_COMMUNITY)
Admission: RE | Disposition: A | Discharge status: Home / Self Care | Payer: Self-pay | Source: Home / Self Care | Attending: Urology

## 2020-08-18 DIAGNOSIS — N201 Calculus of ureter: Secondary | ICD-10-CM

## 2020-08-18 DIAGNOSIS — N202 Calculus of kidney with calculus of ureter: Secondary | ICD-10-CM | POA: Insufficient documentation

## 2020-08-18 DIAGNOSIS — F17200 Nicotine dependence, unspecified, uncomplicated: Secondary | ICD-10-CM | POA: Diagnosis not present

## 2020-08-18 DIAGNOSIS — Z20822 Contact with and (suspected) exposure to covid-19: Secondary | ICD-10-CM | POA: Diagnosis not present

## 2020-08-18 HISTORY — DX: Unspecified viral hepatitis C without hepatic coma: B19.20

## 2020-08-18 HISTORY — DX: Anemia, unspecified: D64.9

## 2020-08-18 HISTORY — DX: Gastrointestinal hemorrhage, unspecified: K92.2

## 2020-08-18 HISTORY — PX: CYSTOSCOPY/URETEROSCOPY/HOLMIUM LASER/STENT PLACEMENT: SHX6546

## 2020-08-18 HISTORY — DX: Other psychoactive substance abuse, uncomplicated: F19.10

## 2020-08-18 LAB — CBC
HCT: 50.3 % (ref 39.0–52.0)
Hemoglobin: 14.2 g/dL (ref 13.0–17.0)
MCH: 29 pg (ref 26.0–34.0)
MCHC: 28.2 g/dL — ABNORMAL LOW (ref 30.0–36.0)
MCV: 102.9 fL — ABNORMAL HIGH (ref 80.0–100.0)
Platelets: 127 10*3/uL — ABNORMAL LOW (ref 150–400)
RBC: 4.89 MIL/uL (ref 4.22–5.81)
RDW: 20.8 % — ABNORMAL HIGH (ref 11.5–15.5)
WBC: 5.9 10*3/uL (ref 4.0–10.5)
nRBC: 0 % (ref 0.0–0.2)

## 2020-08-18 LAB — SARS CORONAVIRUS 2 BY RT PCR (HOSPITAL ORDER, PERFORMED IN ~~LOC~~ HOSPITAL LAB): SARS Coronavirus 2: NEGATIVE

## 2020-08-18 SURGERY — CYSTOSCOPY/URETEROSCOPY/HOLMIUM LASER/STENT PLACEMENT
Anesthesia: General | Laterality: Right

## 2020-08-18 MED ORDER — ONDANSETRON HCL 4 MG/2ML IJ SOLN
INTRAMUSCULAR | Status: AC
  Filled 2020-08-18: qty 2

## 2020-08-18 MED ORDER — FENTANYL CITRATE (PF) 100 MCG/2ML IJ SOLN
INTRAMUSCULAR | Status: DC | PRN
  Administered 2020-08-18: 150 ug via INTRAVENOUS
  Administered 2020-08-18: 50 ug via INTRAVENOUS

## 2020-08-18 MED ORDER — AMISULPRIDE (ANTIEMETIC) 5 MG/2ML IV SOLN: 10.0000 mg | Freq: Once | INTRAVENOUS | Status: DC | PRN

## 2020-08-18 MED ORDER — OXYCODONE-ACETAMINOPHEN 5-325 MG PO TABS: 1.0000 | ORAL_TABLET | ORAL | 0 refills | Status: DC | PRN

## 2020-08-18 MED ORDER — MIDAZOLAM HCL 5 MG/5ML IJ SOLN
INTRAMUSCULAR | Status: DC | PRN
  Administered 2020-08-18: 2 mg via INTRAVENOUS

## 2020-08-18 MED ORDER — CHLORHEXIDINE GLUCONATE 0.12 % MT SOLN
15.0000 mL | Freq: Once | OROMUCOSAL | Status: AC
  Administered 2020-08-18: 15 mL via OROMUCOSAL

## 2020-08-18 MED ORDER — OXYCODONE HCL 5 MG/5ML PO SOLN: 5.0000 mg | Freq: Once | ORAL | Status: DC | PRN

## 2020-08-18 MED ORDER — LACTATED RINGERS IV SOLN: INTRAVENOUS | Status: DC

## 2020-08-18 MED ORDER — DEXAMETHASONE SODIUM PHOSPHATE 10 MG/ML IJ SOLN
INTRAMUSCULAR | Status: AC
  Filled 2020-08-18: qty 1

## 2020-08-18 MED ORDER — ONDANSETRON HCL 4 MG/2ML IJ SOLN: 4.0000 mg | Freq: Once | INTRAMUSCULAR | Status: DC | PRN

## 2020-08-18 MED ORDER — FENTANYL CITRATE (PF) 100 MCG/2ML IJ SOLN: 25.0000 ug | INTRAMUSCULAR | Status: DC | PRN

## 2020-08-18 MED ORDER — PHENYLEPHRINE 40 MCG/ML (10ML) SYRINGE FOR IV PUSH (FOR BLOOD PRESSURE SUPPORT)
PREFILLED_SYRINGE | INTRAVENOUS | Status: AC
  Filled 2020-08-18: qty 10

## 2020-08-18 MED ORDER — PROPOFOL 10 MG/ML IV BOLUS
INTRAVENOUS | Status: AC
  Filled 2020-08-18: qty 20

## 2020-08-18 MED ORDER — CEPHALEXIN 500 MG PO CAPS: 500.0000 mg | ORAL_CAPSULE | Freq: Four times a day (QID) | ORAL | 0 refills | Status: DC

## 2020-08-18 MED ORDER — KETAMINE HCL 10 MG/ML IJ SOLN
INTRAMUSCULAR | Status: AC
  Filled 2020-08-18: qty 1

## 2020-08-18 MED ORDER — IOHEXOL 300 MG/ML  SOLN
INTRAMUSCULAR | Status: DC | PRN
  Administered 2020-08-18: 5 mL

## 2020-08-18 MED ORDER — EPHEDRINE 5 MG/ML INJ
INTRAVENOUS | Status: AC
  Filled 2020-08-18: qty 10

## 2020-08-18 MED ORDER — DEXMEDETOMIDINE (PRECEDEX) IN NS 20 MCG/5ML (4 MCG/ML) IV SYRINGE
PREFILLED_SYRINGE | INTRAVENOUS | Status: DC | PRN
  Administered 2020-08-18 (×5): 4 ug via INTRAVENOUS

## 2020-08-18 MED ORDER — SODIUM CHLORIDE 0.9 % IR SOLN
Status: DC | PRN
  Administered 2020-08-18 (×2): 3000 mL

## 2020-08-18 MED ORDER — FENTANYL CITRATE (PF) 250 MCG/5ML IJ SOLN
INTRAMUSCULAR | Status: AC
  Filled 2020-08-18: qty 5

## 2020-08-18 MED ORDER — LIDOCAINE HCL (CARDIAC) PF 50 MG/5ML IV SOSY
PREFILLED_SYRINGE | INTRAVENOUS | Status: DC | PRN
  Administered 2020-08-18: 100 mg via INTRAVENOUS

## 2020-08-18 MED ORDER — DEXAMETHASONE SODIUM PHOSPHATE 10 MG/ML IJ SOLN
INTRAMUSCULAR | Status: DC | PRN
  Administered 2020-08-18: 10 mg via INTRAVENOUS

## 2020-08-18 MED ORDER — CEFAZOLIN SODIUM-DEXTROSE 2-4 GM/100ML-% IV SOLN
2.0000 g | Freq: Once | INTRAVENOUS | Status: AC
  Administered 2020-08-18: 2 g via INTRAVENOUS
  Filled 2020-08-18: qty 100

## 2020-08-18 MED ORDER — KETAMINE HCL 10 MG/ML IJ SOLN
INTRAMUSCULAR | Status: DC | PRN
  Administered 2020-08-18: 20 mg via INTRAVENOUS

## 2020-08-18 MED ORDER — 0.9 % SODIUM CHLORIDE (POUR BTL) OPTIME
TOPICAL | Status: DC | PRN
  Administered 2020-08-18: 1000 mL

## 2020-08-18 MED ORDER — MIDAZOLAM HCL 2 MG/2ML IJ SOLN
INTRAMUSCULAR | Status: AC
  Filled 2020-08-18: qty 2

## 2020-08-18 MED ORDER — DOCUSATE SODIUM 100 MG PO CAPS: 100.0000 mg | ORAL_CAPSULE | Freq: Every day | ORAL | 0 refills | Status: DC | PRN

## 2020-08-18 MED ORDER — OXYCODONE HCL 5 MG PO TABS: 5.0000 mg | ORAL_TABLET | Freq: Once | ORAL | Status: DC | PRN

## 2020-08-18 MED ORDER — ONDANSETRON HCL 4 MG/2ML IJ SOLN
INTRAMUSCULAR | Status: DC | PRN
  Administered 2020-08-18: 4 mg via INTRAVENOUS

## 2020-08-18 MED ORDER — PROPOFOL 10 MG/ML IV BOLUS
INTRAVENOUS | Status: DC | PRN
  Administered 2020-08-18: 200 mg via INTRAVENOUS

## 2020-08-18 MED ORDER — ORAL CARE MOUTH RINSE: 15.0000 mL | Freq: Once | OROMUCOSAL | Status: AC

## 2020-08-18 SURGICAL SUPPLY — 23 items
BAG URO CATCHER STRL LF (MISCELLANEOUS) ×2 IMPLANT
BASKET ZERO TIP NITINOL 2.4FR (BASKET) ×2 IMPLANT
BENZOIN TINCTURE PRP APPL 2/3 (GAUZE/BANDAGES/DRESSINGS) ×2 IMPLANT
CATH URET 5FR 28IN OPEN ENDED (CATHETERS) ×2 IMPLANT
CLOTH BEACON ORANGE TIMEOUT ST (SAFETY) ×2 IMPLANT
DRSG TEGADERM 2-3/8X2-3/4 SM (GAUZE/BANDAGES/DRESSINGS) ×2 IMPLANT
FIBER LASER MOSES 200 DFL (Laser) IMPLANT
GLOVE SURG ENC TEXT LTX SZ7 (GLOVE) ×2 IMPLANT
GOWN STRL REUS W/TWL LRG LVL3 (GOWN DISPOSABLE) ×2 IMPLANT
GUIDEWIRE STR DUAL SENSOR (WIRE) ×4 IMPLANT
GUIDEWIRE ZIPWRE .038 STRAIGHT (WIRE) ×2 IMPLANT
IV NS 1000ML (IV SOLUTION) ×2
IV NS 1000ML BAXH (IV SOLUTION) ×1 IMPLANT
KIT TURNOVER KIT A (KITS) ×2 IMPLANT
LASER FIB FLEXIVA PULSE ID 365 (Laser) IMPLANT
MANIFOLD NEPTUNE II (INSTRUMENTS) ×2 IMPLANT
PACK CYSTO (CUSTOM PROCEDURE TRAY) ×2 IMPLANT
SHEATH URETERAL 12FRX35CM (MISCELLANEOUS) ×2 IMPLANT
STENT URET 6FRX26 CONTOUR (STENTS) ×2 IMPLANT
TRACTIP FLEXIVA PULS ID 200XHI (Laser) IMPLANT
TRACTIP FLEXIVA PULSE ID 200 (Laser)
TUBING CONNECTING 10 (TUBING) ×2 IMPLANT
TUBING UROLOGY SET (TUBING) ×2 IMPLANT

## 2020-08-18 NOTE — Transfer of Care (Signed)
Immediate Anesthesia Transfer of Care Note  Patient: Hunter Bradshaw  Procedure(s) Performed: CYSTOSCOPY/RETORGRADE/URETEROSCOPY/STENT PLACEMENT (Right )  Patient Location: PACU  Anesthesia Type:General  Level of Consciousness: awake, alert , oriented and patient cooperative  Airway & Oxygen Therapy: Patient Spontanous Breathing and Patient connected to face mask oxygen  Post-op Assessment: Report given to RN, Post -op Vital signs reviewed and stable and Patient moving all extremities X 4  Post vital signs: stable  Last Vitals:  Vitals Value Taken Time  BP 109/74 08/18/20 1500  Temp 36.5 C 08/18/20 1454  Pulse 65 08/18/20 1502  Resp 12 08/18/20 1454  SpO2 100 % 08/18/20 1502  Vitals shown include unvalidated device data.  Last Pain:  Vitals:   08/18/20 1454  TempSrc:   PainSc: Asleep      Patients Stated Pain Goal: 4 (08/18/20 1104)  Complications: No complications documented.

## 2020-08-18 NOTE — Op Note (Signed)
Operative Note  Preoperative diagnosis:  1.  Right ureteral and renal stone  Postoperative diagnosis: 1.  Right ureteral and renal stone  Procedure(s): 1.  Cystoscopy 2. Right retrograde pyelogram 3. Right ureteroscopy with basket extraction of stone 4. Right ureteral stent exchange 5. Fluoroscopy <1 hour with intraoperative interpretation  Surgeon: Jettie Pagan, MD  Assistants:  None  Anesthesia:  General  Complications:  None  EBL:  Minimal  Specimens: 1. Stones for stone analysis  Drains/Catheters: 1.  6Fr 26cm right ureteral stent on a tether string  Intraoperative findings:   1. Cystoscopy demonstrated no suspicious bladder lesions.  Right ureteral stent was seen protruding from the right ureteral orifice.  There is minimal encrustation on the distal curl of the stent. 2. Right ureteroscopy identified 3 mm distal right ureteral stone.  This was easily passed extracted. 3. Right pyeloscopy demonstrated 2 mm right renal pelvis stone.  This was easily passed extracted. 4. Right retrograde pyelogram demonstrated a chronically dilated right renal collecting system.  There was no filling defects or extravasation of contrast.  The contrast drained promptly. 5. Successful right ureteral stent exchange on a tether string  Indication:  Hunter Bradshaw is a 45 y.o. male who initially presented to the ED on 05/07/2020 with a distal right ureteral stone, persistent right flank pain and evidence of infection with hydronephrosis.  He underwent right ureteral stent placement on 05/07/2020.  He is incarcerated and there was some delay and scheduling his case.  He presents today for right ureteral and renal stone treatment.  His preoperative urine culture was negative.  Description of procedure: After informed consent was obtained from the patient, the patient was identified and taken to the operating room and placed in the supine position.  General anesthesia was administered as well as  perioperative IV antibiotics.  At the beginning of the case, a time-out was performed to properly identify the patient, the surgery to be performed, and the surgical site.  Sequential compression devices were applied to the lower extremities at the beginning of the case for DVT prophylaxis.  The patient was then placed in the dorsal lithotomy supine position, prepped and draped in sterile fashion.  Preliminary scout fluoroscopy revealed that there was a right ureteral stent in place in appropriate position.  There was no calcifications identified on fluoroscopy. We then passed the 21-French rigid cystoscope through the urethra again noted a wide caliber bulbar urethral stenosis easily navigated with the scope.  He had a minimally obstructing prostate.  A systematic evaluation of the bladder revealed no evidence of any suspicious bladder lesions.  Ureteral orifices were in normal position.    The distal aspect of the ureteral stent was seen protruding from the right ureteral orifice with minimal encrustation on the distal curl.  We then used the alligator-tooth forceps and grasped the distal end of the ureteral stent and brought it out the urethral meatus while watching the proximal coil straighten out nicely on fluoroscopy. Through the ureteral stent, we then passed a 0.038 sensor wire up to the level of the renal pelvis.  The ureteral stent was then removed, leaving the sensor wire up the right ureter.    A semi-rigid ureteroscope was passed alongside the wire up the distal ureter which appeared normal.  We encountered a 3 mm distal right ureteral stone.  I used a 0 tip basket and easily remove this through his right ureter without difficulty.  His right ureter was patent and dilated given presence of  stent for several months.  There were no further ureteral stones identified.  Prior to removing the semirigid ureteroscope, I passed a 0.038 zip wire up to the level of the renal pelvis. The flexible  ureteroscope was advanced into the collecting system over this zip wire.  I surveyed the collecting system and noticed some debris present.  I irrigated approximately 30 mm of solution.  I then encountered a 2 mm renal stone.  This was basket distracted and removed without difficulty.  I then repassed the ureteroscope up into the collecting system and identified no further stone fragments.  I performed a right retrograde pyelogram demonstrating chronically dilated right collecting system.  I surveyed each individual calyx and encountered no further stones.  We then withdrew the ureteroscope back down the ureter noting no evidence of any stones along the course of the ureter.  Prior to removing the ureteroscope, we did pass the Glidewire back up to the ureter to the renal pelvis.  Once the ureteroscope was removed, the Glidewire was backloaded through the rigid cystoscope, which was then advanced down the urethra and into the bladder. We then used the Glidewire under direct vision through the rigid cystoscope and under fluoroscopic guidance and passed up a 6-French, 26 cm double-pigtail ureteral stent up ureter, making sure that the proximal and distal ends coiled within the kidney and bladder respectively.  Note that we left a long tether string attached to the distal end of the ureteral stent and it exited the urethral meatus and was secured to the penile shaft with a tegaderm adhesive.  The cystoscope was then advanced back into the bladder under vision.  We were able to see the distal stent coiling nicely within the bladder.  The bladder was then emptied with irrigation solution.  The cystoscope was then removed.    The patient tolerated the procedure well and there was no complication. Patient was awoken from anesthesia and taken to the recovery room in stable condition. I was present and scrubbed for the entirety of the case.  Plan:  Patient will be discharged.  He will remove his right ureteral stent  in 3 days by pulling on test string.  He will follow-up in the office in approximately 1 month for renal ultrasound.  Matt R. Raelynn Corron MD [facility]  Pager: [phone]

## 2020-08-18 NOTE — Anesthesia Preprocedure Evaluation (Signed)
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, Hepatitis -, C  Endo/Other  negative endocrine ROS  Renal/GU Renal disease (R ureteral stone)  negative genitourinary   Musculoskeletal negative musculoskeletal ROS (+) narcotic dependent  Abdominal   Peds  Hematology negative hematology ROS (+)   Anesthesia Other Findings   Reproductive/Obstetrics                            Anesthesia Physical Anesthesia Plan  ASA: III  Anesthesia Plan: General   Post-op Pain Management:    Induction: Intravenous  PONV Risk Score and Plan: 1 and Ondansetron, Dexamethasone, Midazolam and Treatment may vary due to age or medical condition  Airway Management Planned: LMA  Additional Equipment: None  Intra-op Plan:   Post-operative Plan: Extubation in OR  Informed Consent: I have reviewed the patients History and Physical, chart, labs and discussed the procedure including the risks, benefits and alternatives for the proposed anesthesia with the patient or authorized representative who has indicated his/her understanding and acceptance.     Dental advisory given  Plan Discussed with:   Anesthesia Plan Comments:         Anesthesia Quick Evaluation

## 2020-08-18 NOTE — Anesthesia Procedure Notes (Signed)
Procedure Name: LMA Insertion Date/Time: 08/18/2020 1:58 PM Performed by: Illene Silver, CRNA Pre-anesthesia Checklist: Patient identified, Emergency Drugs available, Suction available and Patient being monitored Patient Re-evaluated:Patient Re-evaluated prior to induction Oxygen Delivery Method: Circle system utilized Preoxygenation: Pre-oxygenation with 100% oxygen Induction Type: IV induction LMA: LMA with gastric port inserted LMA Size: 5.0 Tube type: Oral Number of attempts: 1 Airway Equipment and Method: Oral airway Placement Confirmation: positive ETCO2 Tube secured with: Tape Dental Injury: Teeth and Oropharynx as per pre-operative assessment
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
## 2020-08-18 NOTE — Anesthesia Postprocedure Evaluation (Signed)
Anesthesia Post Note  Patient: Hunter Bradshaw  Procedure(s) Performed: CYSTOSCOPY/RETORGRADE/URETEROSCOPY/STENT PLACEMENT (Right )     Anesthesia Post Evaluation No complications documented.  Last Vitals:  Vitals:   08/18/20 1042 08/18/20 1454  BP: 131/90 110/72  Pulse: 73 66  Resp: 18 12  Temp: 36.6 C 36.5 C  SpO2: 100% 100%    Last Pain:  Vitals:   08/18/20 1454  TempSrc:   PainSc: Asleep                 Deondrick Searls E

## 2020-08-21 ENCOUNTER — Encounter (HOSPITAL_COMMUNITY): Payer: Self-pay | Admitting: Urology

## 2020-08-21 NOTE — Anesthesia Postprocedure Evaluation (Signed)
Anesthesia Post Note  Patient: Hunter Bradshaw  Procedure(s) Performed: CYSTOSCOPY/RETORGRADE/URETEROSCOPY/STENT PLACEMENT (Right )     Patient location during evaluation: PACU Anesthesia Type: General Level of consciousness: awake and alert Pain management: pain level controlled Vital Signs Assessment: post-procedure vital signs reviewed and stable Respiratory status: spontaneous breathing, nonlabored ventilation and respiratory function stable Cardiovascular status: blood pressure returned to baseline and stable Postop Assessment: no apparent nausea or vomiting Anesthetic complications: no   No complications documented.  Last Vitals:  Vitals:   08/18/20 1540 08/18/20 1600  BP: (!) 121/96 126/83  Pulse: 71 74  Resp: 15 16  Temp: 36.4 C   SpO2: 99% 100%    Last Pain:  Vitals:   08/18/20 1600  TempSrc:   PainSc: 0-No pain                 Lucretia Kern
